# Patient Record
Sex: Female | Born: 1955 | Race: White | Hispanic: No | Marital: Married | Smoking: Former smoker
Health system: Southern US, Community
[De-identification: ages and names within clinical notes are randomized; demographics above are authoritative.]

## PROBLEM LIST (undated history)

## (undated) DIAGNOSIS — M810 Age-related osteoporosis without current pathological fracture: Principal | ICD-10-CM

## (undated) DIAGNOSIS — E7800 Pure hypercholesterolemia, unspecified: Secondary | ICD-10-CM

## (undated) DIAGNOSIS — C801 Malignant (primary) neoplasm, unspecified: Secondary | ICD-10-CM

## (undated) DIAGNOSIS — K589 Irritable bowel syndrome without diarrhea: Secondary | ICD-10-CM

## (undated) HISTORY — DX: Irritable bowel syndrome, unspecified: K58.9

## (undated) HISTORY — PX: OTHER SURGICAL HISTORY: SHX169

## (undated) HISTORY — DX: Malignant (primary) neoplasm, unspecified: C80.1

## (undated) HISTORY — DX: Pure hypercholesterolemia, unspecified: E78.00

## (undated) HISTORY — PX: VAGINAL HYSTERECTOMY: SUR661

## (undated) HISTORY — DX: Age-related osteoporosis without current pathological fracture: M81.0

## (undated) HISTORY — PX: BREAST SURGERY: SHX581

## (undated) HISTORY — PX: TUBAL LIGATION: SHX77

---

## 2001-06-23 ENCOUNTER — Other Ambulatory Visit: Admission: RE | Admit: 2001-06-23 | Discharge: 2001-06-23 | Payer: Self-pay | Admitting: *Deleted

## 2002-07-09 ENCOUNTER — Encounter: Admission: RE | Admit: 2002-07-09 | Discharge: 2002-07-09 | Payer: Self-pay | Admitting: *Deleted

## 2002-08-31 ENCOUNTER — Other Ambulatory Visit: Admission: RE | Admit: 2002-08-31 | Discharge: 2002-08-31 | Payer: Self-pay | Admitting: Gynecology

## 2003-03-04 ENCOUNTER — Observation Stay (HOSPITAL_COMMUNITY): Admission: RE | Admit: 2003-03-04 | Discharge: 2003-03-05 | Payer: Self-pay | Admitting: Gynecology

## 2003-11-18 ENCOUNTER — Other Ambulatory Visit: Admission: RE | Admit: 2003-11-18 | Discharge: 2003-11-18 | Payer: Self-pay | Admitting: Gynecology

## 2004-10-22 ENCOUNTER — Ambulatory Visit (HOSPITAL_COMMUNITY): Admission: RE | Admit: 2004-10-22 | Discharge: 2004-10-23 | Payer: Self-pay | Admitting: Specialist

## 2005-03-08 ENCOUNTER — Other Ambulatory Visit: Admission: RE | Admit: 2005-03-08 | Discharge: 2005-03-08 | Payer: Self-pay | Admitting: Gynecology

## 2005-09-23 HISTORY — PX: OOPHORECTOMY: SHX86

## 2008-05-24 ENCOUNTER — Ambulatory Visit: Payer: Self-pay | Admitting: Obstetrics and Gynecology

## 2008-05-25 ENCOUNTER — Ambulatory Visit: Payer: Self-pay | Admitting: Obstetrics and Gynecology

## 2008-06-02 ENCOUNTER — Ambulatory Visit: Payer: Self-pay | Admitting: Obstetrics and Gynecology

## 2008-06-03 ENCOUNTER — Encounter: Admission: RE | Admit: 2008-06-03 | Discharge: 2008-06-03 | Payer: Self-pay | Admitting: General Surgery

## 2008-06-08 ENCOUNTER — Ambulatory Visit: Admission: RE | Admit: 2008-06-08 | Discharge: 2008-08-26 | Payer: Self-pay | Admitting: Radiation Oncology

## 2008-07-27 ENCOUNTER — Encounter: Admission: RE | Admit: 2008-07-27 | Discharge: 2008-07-27 | Payer: Self-pay | Admitting: Radiation Oncology

## 2008-07-29 ENCOUNTER — Encounter: Admission: RE | Admit: 2008-07-29 | Discharge: 2008-07-29 | Payer: Self-pay | Admitting: Radiation Oncology

## 2008-07-29 ENCOUNTER — Encounter (INDEPENDENT_AMBULATORY_CARE_PROVIDER_SITE_OTHER): Payer: Self-pay | Admitting: Diagnostic Radiology

## 2008-08-16 ENCOUNTER — Encounter: Payer: Self-pay | Admitting: Obstetrics and Gynecology

## 2008-08-16 ENCOUNTER — Ambulatory Visit: Payer: Self-pay | Admitting: Obstetrics and Gynecology

## 2008-08-16 ENCOUNTER — Other Ambulatory Visit: Admission: RE | Admit: 2008-08-16 | Discharge: 2008-08-16 | Payer: Self-pay | Admitting: Obstetrics and Gynecology

## 2008-08-29 ENCOUNTER — Ambulatory Visit: Payer: Self-pay | Admitting: Obstetrics and Gynecology

## 2008-09-26 ENCOUNTER — Ambulatory Visit: Payer: Self-pay | Admitting: Obstetrics and Gynecology

## 2008-10-14 ENCOUNTER — Ambulatory Visit: Payer: Self-pay | Admitting: Obstetrics and Gynecology

## 2008-10-14 ENCOUNTER — Inpatient Hospital Stay (HOSPITAL_COMMUNITY): Admission: RE | Admit: 2008-10-14 | Discharge: 2008-10-18 | Payer: Self-pay | Admitting: General Surgery

## 2008-10-14 ENCOUNTER — Encounter (INDEPENDENT_AMBULATORY_CARE_PROVIDER_SITE_OTHER): Payer: Self-pay | Admitting: General Surgery

## 2008-11-14 ENCOUNTER — Ambulatory Visit: Payer: Self-pay | Admitting: Obstetrics and Gynecology

## 2008-12-07 ENCOUNTER — Ambulatory Visit: Payer: Self-pay | Admitting: Obstetrics and Gynecology

## 2009-01-31 ENCOUNTER — Ambulatory Visit: Admission: RE | Admit: 2009-01-31 | Discharge: 2009-03-30 | Payer: Self-pay | Admitting: Radiation Oncology

## 2009-02-23 ENCOUNTER — Ambulatory Visit: Payer: Self-pay | Admitting: Psychiatry

## 2009-03-02 ENCOUNTER — Ambulatory Visit: Payer: Self-pay | Admitting: Psychiatry

## 2009-03-02 ENCOUNTER — Encounter: Admission: RE | Admit: 2009-03-02 | Discharge: 2009-03-07 | Payer: Self-pay | Admitting: Anesthesiology

## 2009-03-07 ENCOUNTER — Ambulatory Visit: Payer: Self-pay | Admitting: Anesthesiology

## 2009-03-16 ENCOUNTER — Ambulatory Visit: Payer: Self-pay | Admitting: Psychiatry

## 2009-04-05 ENCOUNTER — Ambulatory Visit (HOSPITAL_BASED_OUTPATIENT_CLINIC_OR_DEPARTMENT_OTHER): Admission: RE | Admit: 2009-04-05 | Discharge: 2009-04-05 | Payer: Self-pay | Admitting: Plastic Surgery

## 2009-04-05 ENCOUNTER — Encounter (INDEPENDENT_AMBULATORY_CARE_PROVIDER_SITE_OTHER): Payer: Self-pay | Admitting: Plastic Surgery

## 2009-08-31 ENCOUNTER — Encounter (INDEPENDENT_AMBULATORY_CARE_PROVIDER_SITE_OTHER): Payer: Self-pay | Admitting: Plastic Surgery

## 2009-08-31 ENCOUNTER — Inpatient Hospital Stay (HOSPITAL_COMMUNITY): Admission: RE | Admit: 2009-08-31 | Discharge: 2009-09-03 | Payer: Self-pay | Admitting: Plastic Surgery

## 2009-12-08 ENCOUNTER — Ambulatory Visit: Payer: Self-pay | Admitting: Obstetrics and Gynecology

## 2009-12-08 ENCOUNTER — Other Ambulatory Visit: Admission: RE | Admit: 2009-12-08 | Discharge: 2009-12-08 | Payer: Self-pay | Admitting: Obstetrics and Gynecology

## 2009-12-21 ENCOUNTER — Ambulatory Visit: Payer: Self-pay | Admitting: Oncology

## 2009-12-25 LAB — CBC WITH DIFFERENTIAL/PLATELET
BASO%: 0.6 % (ref 0.0–2.0)
Basophils Absolute: 0 10*3/uL (ref 0.0–0.1)
EOS%: 3.8 % (ref 0.0–7.0)
Eosinophils Absolute: 0.2 10*3/uL (ref 0.0–0.5)
HCT: 33.6 % — ABNORMAL LOW (ref 34.8–46.6)
HGB: 11.5 g/dL — ABNORMAL LOW (ref 11.6–15.9)
LYMPH%: 24.2 % (ref 14.0–49.7)
MCH: 34.4 pg — ABNORMAL HIGH (ref 25.1–34.0)
MCHC: 34.2 g/dL (ref 31.5–36.0)
MCV: 100.6 fL (ref 79.5–101.0)
MONO#: 0.4 10*3/uL (ref 0.1–0.9)
MONO%: 6.9 % (ref 0.0–14.0)
NEUT#: 3.6 10*3/uL (ref 1.5–6.5)
NEUT%: 64.5 % (ref 38.4–76.8)
Platelets: 202 10*3/uL (ref 145–400)
RBC: 3.34 10*6/uL — ABNORMAL LOW (ref 3.70–5.45)
RDW: 13.5 % (ref 11.2–14.5)
WBC: 5.5 10*3/uL (ref 3.9–10.3)
lymph#: 1.3 10*3/uL (ref 0.9–3.3)

## 2009-12-25 LAB — COMPREHENSIVE METABOLIC PANEL
ALT: 18 U/L (ref 0–35)
AST: 19 U/L (ref 0–37)
Albumin: 4.5 g/dL (ref 3.5–5.2)
Alkaline Phosphatase: 56 U/L (ref 39–117)
BUN: 11 mg/dL (ref 6–23)
CO2: 33 mEq/L — ABNORMAL HIGH (ref 19–32)
Calcium: 9.8 mg/dL (ref 8.4–10.5)
Chloride: 102 mEq/L (ref 96–112)
Creatinine, Ser: 0.63 mg/dL (ref 0.40–1.20)
Glucose, Bld: 101 mg/dL — ABNORMAL HIGH (ref 70–99)
Potassium: 4.2 mEq/L (ref 3.5–5.3)
Sodium: 140 mEq/L (ref 135–145)
Total Bilirubin: 0.6 mg/dL (ref 0.3–1.2)
Total Protein: 6.8 g/dL (ref 6.0–8.3)

## 2010-05-14 ENCOUNTER — Ambulatory Visit: Payer: Self-pay | Admitting: Obstetrics and Gynecology

## 2010-10-03 ENCOUNTER — Ambulatory Visit
Admission: RE | Admit: 2010-10-03 | Discharge: 2010-10-03 | Payer: Self-pay | Source: Home / Self Care | Attending: Plastic Surgery | Admitting: Plastic Surgery

## 2010-10-08 LAB — POCT HEMOGLOBIN-HEMACUE: Hemoglobin: 11.9 g/dL — ABNORMAL LOW (ref 12.0–15.0)

## 2010-10-14 ENCOUNTER — Encounter: Payer: Self-pay | Admitting: Radiation Oncology

## 2010-10-20 NOTE — Op Note (Signed)
NAME:  ANYELIN, MOGLE NO.:  [id number]  MEDICAL RECORD NO.:  [id number]          PATIENT TYPE:  AMB  LOCATION:  DSC                          FACILITY:  [facility]  PHYSICIAN:  Loreta Ave, MD DATE OF BIRTH:  08/06/1956  DATE OF PROCEDURE:  10/03/2010 DATE OF DISCHARGE:                              OPERATIVE REPORT   PREOPERATIVE DIAGNOSIS:  Breast cancer.  POSTOPERATIVE DIAGNOSIS:  Breast cancer.  SURGEON:  Loreta Ave, MD  ASSISTANT:  Tonye Becket.  ANESTHESIA:  General.  PROCEDURES PERFORMED: 1. Left breast implant exchange. 2. Left breast capsulotomy.  IV FLUIDS:  500 mL of crystalloid.  URINE OUTPUT:  Not recorded.  ESTIMATED BLOOD LOSS:  Minimal.  COMPLICATIONS:  None.  CLINICAL INDICATION:  Beth Noble is a 55 year old female who is status post bilateral breast reconstruction with breast cancer.  She has breast asymmetry stemming from the fact that her right breast has previously had a tissue expander exposure, which was allowed to heal and then subsequently reconstructed with a latissimus flap.  Her left breast was reconstructed with a tissue expander and subsequent exchange for permanent implant.  Her right breast is somewhat tighter and her implant is superiorly displaced compared to the left breast.  Preoperatively, she would requested that her breast achieved better symmetry.  I told her I felt that it would be very difficult to lower her right breast given the tight nature of the skin and subcutaneous tissue at the inframammary fold.  Alternatively, I presented her the option of trying to elevate the left breast implant.  She understands the risks of surgery to include, but not be limited to ongoing breast asymmetry, failure to correct her deformity as well as bleeding, infection, damage to nearby structures and implant-related complications such as fatigue, migration, capsular contracture, failure and the need for  more surgery. She desires to proceed.  DESCRIPTION OF OPERATION:  The patient was marked in the preoperative holding area and brought to the operating room.  She was placed in the supine position on the operating room table and after smooth and routine induction of general anesthesia, the chest was prepped with chlorhexidine.  Ancef was given preoperatively.  The lateral 6 cm of the left mastectomy scar were excised with a 10-blade and sent to pathology labeled as such.  Next, a 2-cm stair step was designed by elevating the inferior mastectomy flaps to place the capsular incision away from the skin incision.  This elevation was done with electrocautery and the capsule was entered.  Her previous breast implant was found to be intact and was removed with manual traction.  Next, the superomedial 40% of her capsule was incised with electrocautery around its periphery and the pectoralis major muscle was divided with electrocautery to allow elevation of the implant pocket and medialization of the implant.  Next, 16 mL of 0.5% Marcaine with epinephrine were injected about the surgical sites for postoperative analgesia.  The left mastectomy defect was irrigated and hemostasis verified with electrocautery.  Also in an attempt to change the implant location, the inferior 2 cm on the lateral portion of the inframammary fold were imbricated with 3-0 Vicryl pop-off sutures placed in  figure-of-eight fashion.  Next, a Natrelle silicone implant reference number 10-330, serial number [id number] was then brought onto the field and maintained in a bacitracin containing bath of normal saline.  This antibiotic solution was also used to irrigate the left mastectomy defect and skin and gloves were wiped clean.  This implant was then placed using a minimal touch technique into the left mastectomy defect.  Symmetry was found to be improved and there was medial and superior migration of the implant pocket.  Next,  the capsule was closed with interrupted 3-0 Vicryl pop-off sutures in figure-of-eight fashion. Care was taken not to touch the implant with needle thread or instrument anytime.  Next, the skin was closed with buried interrupted 3-0 Monocryl sutures as well as 4-0 running subcuticular Monocryl stitch.  Dermabond and Steri-Strips were then applied.  The patient tolerated the procedure well, was extubated and transported to the recovery room in stable condition.     Loreta Ave, MD     CF/MEDQ  D:  10/03/2010  T:  10/04/2010  Job:  [id number]  Electronically Signed by Loreta Ave MD on 10/20/2010 03:18:54 PM

## 2010-12-18 ENCOUNTER — Other Ambulatory Visit (INDEPENDENT_AMBULATORY_CARE_PROVIDER_SITE_OTHER): Payer: BC Managed Care – PPO

## 2010-12-18 DIAGNOSIS — E7800 Pure hypercholesterolemia, unspecified: Secondary | ICD-10-CM

## 2010-12-25 ENCOUNTER — Encounter (INDEPENDENT_AMBULATORY_CARE_PROVIDER_SITE_OTHER): Payer: BC Managed Care – PPO | Admitting: Obstetrics and Gynecology

## 2010-12-25 ENCOUNTER — Other Ambulatory Visit: Payer: Self-pay | Admitting: Obstetrics and Gynecology

## 2010-12-25 ENCOUNTER — Other Ambulatory Visit (HOSPITAL_COMMUNITY)
Admission: RE | Admit: 2010-12-25 | Discharge: 2010-12-25 | Disposition: A | Discharge status: Home / Self Care | Payer: BC Managed Care – PPO | Source: Ambulatory Visit | Attending: Obstetrics and Gynecology | Admitting: Obstetrics and Gynecology

## 2010-12-25 DIAGNOSIS — Z01419 Encounter for gynecological examination (general) (routine) without abnormal findings: Secondary | ICD-10-CM

## 2010-12-25 DIAGNOSIS — Z124 Encounter for screening for malignant neoplasm of cervix: Secondary | ICD-10-CM | POA: Insufficient documentation

## 2010-12-25 LAB — CBC
HCT: 26.5 % — ABNORMAL LOW (ref 36.0–46.0)
HCT: 36.2 % (ref 36.0–46.0)
Hemoglobin: 12.5 g/dL (ref 12.0–15.0)
Hemoglobin: 9 g/dL — ABNORMAL LOW (ref 12.0–15.0)
MCHC: 34 g/dL (ref 30.0–36.0)
MCHC: 34.6 g/dL (ref 30.0–36.0)
MCV: 102.5 fL — ABNORMAL HIGH (ref 78.0–100.0)
MCV: 102.9 fL — ABNORMAL HIGH (ref 78.0–100.0)
Platelets: 158 10*3/uL (ref 150–400)
Platelets: 210 10*3/uL (ref 150–400)
RBC: 2.58 MIL/uL — ABNORMAL LOW (ref 3.87–5.11)
RBC: 3.53 MIL/uL — ABNORMAL LOW (ref 3.87–5.11)
RDW: 12.5 % (ref 11.5–15.5)
RDW: 12.9 % (ref 11.5–15.5)
WBC: 4.4 10*3/uL (ref 4.0–10.5)
WBC: 5.4 10*3/uL (ref 4.0–10.5)

## 2010-12-30 LAB — POCT HEMOGLOBIN-HEMACUE: Hemoglobin: 11.7 g/dL — ABNORMAL LOW (ref 12.0–15.0)

## 2011-01-03 ENCOUNTER — Ambulatory Visit: Payer: BC Managed Care – PPO | Attending: Radiation Oncology | Admitting: Radiation Oncology

## 2011-01-07 LAB — URINALYSIS, ROUTINE W REFLEX MICROSCOPIC
Bilirubin Urine: NEGATIVE
Glucose, UA: NEGATIVE mg/dL
Hgb urine dipstick: NEGATIVE
Ketones, ur: NEGATIVE mg/dL
Nitrite: NEGATIVE
Protein, ur: NEGATIVE mg/dL
Specific Gravity, Urine: 1.005 (ref 1.005–1.030)
Urobilinogen, UA: 0.2 mg/dL (ref 0.0–1.0)
pH: 6.5 (ref 5.0–8.0)

## 2011-01-07 LAB — DIFFERENTIAL
Basophils Absolute: 0.1 10*3/uL (ref 0.0–0.1)
Basophils Relative: 1 % (ref 0–1)
Eosinophils Absolute: 0.1 10*3/uL (ref 0.0–0.7)
Eosinophils Relative: 3 % (ref 0–5)
Lymphocytes Relative: 28 % (ref 12–46)
Lymphs Abs: 1.5 10*3/uL (ref 0.7–4.0)
Monocytes Absolute: 0.4 10*3/uL (ref 0.1–1.0)
Monocytes Relative: 7 % (ref 3–12)
Neutro Abs: 3.2 10*3/uL (ref 1.7–7.7)
Neutrophils Relative %: 60 % (ref 43–77)

## 2011-01-07 LAB — URINE MICROSCOPIC-ADD ON

## 2011-01-07 LAB — BASIC METABOLIC PANEL
BUN: 3 mg/dL — ABNORMAL LOW (ref 6–23)
CO2: 28 mEq/L (ref 19–32)
Calcium: 8.1 mg/dL — ABNORMAL LOW (ref 8.4–10.5)
Chloride: 101 mEq/L (ref 96–112)
Creatinine, Ser: 0.53 mg/dL (ref 0.4–1.2)
GFR calc Af Amer: >60 mL/min (ref >60)
GFR calc non Af Amer: >60 mL/min (ref >60)
Glucose, Bld: 87 mg/dL (ref 70–99)
Potassium: 3.3 mEq/L — ABNORMAL LOW (ref 3.5–5.1)
Sodium: 136 mEq/L (ref 135–145)

## 2011-01-07 LAB — COMPREHENSIVE METABOLIC PANEL
ALT: 23 U/L (ref 0–35)
AST: 23 U/L (ref 0–37)
Albumin: 4.7 g/dL (ref 3.5–5.2)
Alkaline Phosphatase: 74 U/L (ref 39–117)
BUN: 7 mg/dL (ref 6–23)
CO2: 29 mEq/L (ref 19–32)
Calcium: 9.9 mg/dL (ref 8.4–10.5)
Chloride: 102 mEq/L (ref 96–112)
Creatinine, Ser: 0.52 mg/dL (ref 0.4–1.2)
GFR calc Af Amer: >60 mL/min (ref >60)
GFR calc non Af Amer: >60 mL/min (ref >60)
Glucose, Bld: 103 mg/dL — ABNORMAL HIGH (ref 70–99)
Potassium: 4.1 mEq/L (ref 3.5–5.1)
Sodium: 141 mEq/L (ref 135–145)
Total Bilirubin: 0.6 mg/dL (ref 0.3–1.2)
Total Protein: 6.8 g/dL (ref 6.0–8.3)

## 2011-01-07 LAB — CBC
HCT: 27.1 % — ABNORMAL LOW (ref 36.0–46.0)
HCT: 37.5 % (ref 36.0–46.0)
Hemoglobin: 12.6 g/dL (ref 12.0–15.0)
Hemoglobin: 9.1 g/dL — ABNORMAL LOW (ref 12.0–15.0)
MCHC: 33.5 g/dL (ref 30.0–36.0)
MCHC: 33.6 g/dL (ref 30.0–36.0)
MCV: 101.1 fL — ABNORMAL HIGH (ref 78.0–100.0)
MCV: 101.8 fL — ABNORMAL HIGH (ref 78.0–100.0)
Platelets: 175 10*3/uL (ref 150–400)
Platelets: 219 10*3/uL (ref 150–400)
RBC: 2.66 MIL/uL — ABNORMAL LOW (ref 3.87–5.11)
RBC: 3.71 MIL/uL — ABNORMAL LOW (ref 3.87–5.11)
RDW: 12.1 % (ref 11.5–15.5)
RDW: 12.2 % (ref 11.5–15.5)
WBC: 5.3 10*3/uL (ref 4.0–10.5)
WBC: 7.9 10*3/uL (ref 4.0–10.5)

## 2011-01-09 ENCOUNTER — Encounter (INDEPENDENT_AMBULATORY_CARE_PROVIDER_SITE_OTHER): Payer: BC Managed Care – PPO

## 2011-01-09 DIAGNOSIS — M899 Disorder of bone, unspecified: Secondary | ICD-10-CM

## 2011-02-05 NOTE — Op Note (Signed)
NAME:  Beth, Noble       ACCOUNT NO.:  [id number]   MEDICAL RECORD NO.:  [id number]          PATIENT TYPE:  OIB   LOCATION:  5156                         FACILITY:  [facility]   PHYSICIAN:  Daniel L. Gottsegen, M.D.DATE OF BIRTH:  09-Jul-1956   DATE OF PROCEDURE:  10/14/2008  DATE OF DISCHARGE:                               OPERATIVE REPORT   PREOPERATIVE DIAGNOSIS:  Left lower quadrant pain with endometriosis.   POSTOPERATIVE DIAGNOSIS:  Left lower quadrant pain with endometriosis.   NAME OF OPERATION:  Laparoscopy with initial left oophorectomy including  the cervix followed by right salpingo-oophorectomy.   SURGEON:  Daniel L. Eda Paschal, MD.   FIRST ASSISTANT:  Lorne Skeens. Hoxworth, MD.   INDICATIONS:  The patient is a 55 year old gravida 3, para 2, AB1 who by  history was status post supracervical hysterectomy and right salpingo-  oophorectomy done at 2 different procedures.  The patient's findings had  been endometriosis.  She continued to have pain and at the time of her  breast cancer surgery, which was to be this morning she was also going  to have  a laparoscopy to remove her left ovary for relief of this  persistent left lower quadrant pain.  The patient does understand that  this may not completely relieve her discomfort.  She also knows that we  may not be able to do this laparoscopically.  We will not make an  incision, but we will let the general surgeon and the plastic surgeon do  her mastectomy and reconstruction and not proceed with an open  laparotomy for left salpingo-oophorectomy.   FINDINGS:  At the time of laparoscopy, we initially directed our  attention to the left side.  There was stenotic left ovary adherent into  the vaginal cuff as that ovary was removed, we inadvertently entered the  vagina in order to free up the ovary and found that we actually had also  gotten around her cervix, and therefore, her cervix was removed as well.  Once this part  of the procedure had been done, we inspected the right  lower quadrant and even though her operative note had been read as well  as her Pathology report from [place],  there was a normal right ovary  and tube.  The patient is now menopausal and based on her wishes the  plan at that point then was to remove that as well.   PROCEDURE:  After general endotracheal anesthesia, the patient was  placed in a dorsal lithotomy position, prepped and draped in a usual  sterile manner.  Using an Optiview and a 10-mm laparoscope, direct entry  into the peritoneal cavity was done without difficulty.  The  pneumoperitoneum was created and it been an atraumatic entry.  Two 5-mm  ports were placed in the left lower quadrant without difficulty.  Using  a instrument to elevate the left adnexal  staying clearly away from the  ureter and using a ligature, the left ovary could be dissected free from  the surrounding structures without difficulty, without significant  bleeding.  It was noted, however, that it was extremely fibrotic and  scarred and it was  fairly adherent to the peritoneum.  As we tried to  get around it, it did become obvious to [place] that it was adherent to her  cervix.  However, finally trying to exercise the entire thing.  We found  that we had entered the vaginal cuff posteriorly.  We completely excised  this mass, which we found at that point consisted of ovary and cervix;  however, at the point laparoscopically, you could only see the  cervical  part of it and  the assumption was that this was just ovary, however, it  did include the cervix as well, although it was not obvious at that  time.  Once the adnexa had been completely separated from the broad  ligament and from the vaginal cuff, Dr. Eda Paschal went below and the  structure was removed.  At this point, it was obvious that we had cervix  as well as ovary and in retrospect it would not have been possible to  have removed the ovary  without removing the cervix as they were so  densely adherent to each other.  The vaginal cuff was then closed with  figure-of-eight of 0 Vicryl with complete control of hemostasis.  The  left ovary and cervix were sent to Pathology for tissue diagnosis.  Dr.  Eda Paschal then went above, during the time that he was re-gloving, Dr.  Johna Sheriff was looking in the right lower quadrant and found that indeed  she still had a right ovary and tube.  This could be elevated away from  all the other structures.  It was removed using a ligature for control  of hemostasis and cutting and then this was removed with an Endopouch as  well.  Copious irrigation was then done with sterile saline.  Two-  sponge, needle, and instrument counts were correct.  All trocars were  removed, and the skin incisions were all closed with Dermabond.  The  right ovary and tube were also sent to Pathology for tissue diagnosis.  Estimated blood loss was less than 100 mL with none replaced.  The  patient did not leave the operating room at that point.  She was then  prepared for her mastectomy, at that point, however, the urine was still  clear.  At one point, we had put in indigo carmine, but then we both  felt comfortable that there had been no ureteral involvement in all of  the above surgery.      Daniel L. Eda Paschal, M.D.  Electronically Signed     DLG/MEDQ  D:  10/14/2008  T:  10/15/2008  Job:  [id number]

## 2011-02-05 NOTE — Op Note (Signed)
NAME:  Beth Noble, Beth Noble       ACCOUNT NO.:  [id number]   MEDICAL RECORD NO.:  [id number]          PATIENT TYPE:  OIB   LOCATION:  2550                         FACILITY:  [facility]   PHYSICIAN:  Sharlet Salina T. 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   SURGICAL PROCEDURES:  Bilateral total mastectomy with right axillary  sentinel lymph node biopsy.   SURGEON:  Dr. Johna Sheriff   ANESTHESIA:  General.   BRIEF HISTORY:  Ms. 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  She had a persistent seroma  in the right breast that has been of concern with repeated aspirations.  Follow-up imaging has now revealed new microcalcifications adjacent to  the previous lumpectomy site and biopsy has revealed recurrent ductal  carcinoma in situ.  After discussion of options, we have elected to  proceed with bilateral total mastectomy and right axillary sentinel  lymph node biopsy and immediate reconstruction.  The nature of  procedures, alternatives, risks of bleeding, infection, neurovascular  injury, wound healing issues were discussed and understood  preoperatively.  She is now brought to the operating room for this  procedure.   DESCRIPTION OF PROCEDURE:  At the completion of Dr. Verl Dicker pelvic  surgery, the patient was positioned supine, arms extended and the entire  chest and upper arms widely sterilely prepped and draped.  She received  1 mCi of technetium sulfur colloid injected intradermally around the  right nipple about 2 hours previously and 10 cc of methylene blue was  injected with sterile technique  subareolar and massaged for several  minutes.  This was on the right side.  Right-sided initially was  approached.  An elliptical incision was made encompassing the lateral  lumpectomy site and seroma.  Dissection was carried down to the  subcutaneous tissue.  Skin and subcutaneous flaps were then raised with  cautery superiorly up toward the clavicle to the upper edge of the  breast tissue, medially over to the edge of the sternum, inferiorly down  to the inframammary crease.  Laterally, the flaps were created up over  the seroma without getting into it and this was extended out to the  latissimus laterally which was identified.  The specimen was then  dissected up off of the chest wall with cautery, preserving the  pectoralis fascia for the most part medially and over the center of the  muscle.  As dissection progressed out laterally, we came up to the  seroma cavity and there was a fair amount of inflammation and firmness  here with adhesion to the edge of the pectoralis major, to the serratus  muscle and actually to the anterior border of the latissimus dorsi.  Dissection was carried outside the seroma cavity into normal tissue  which involved removing  a small portion of all these muscles with the  seroma cavity to get back to normal healthy-appearing tissue.  Further  attachments along the latissimus were then mobilized.  I dissected the  specimen up to the axillary content.  The axillary tissue was exposed.  The NeoProbe was used to localize an area hot in the axilla which was  fairly high and medial along the pectoral nerve.  Dissection was carried  down onto this area and a bright blue lymph node identified was  dissected free.  Vascular pedicles were controlled with clips and it was  completely excised.  Ex vivo, the node had counts of greater than 800  with background around 15.  This was sent for hot blue sentinel lymph  node.  This returned showing no cancer cells.  We then  came across the  tail of Spence at the junction of the axilla with Kelly clamps.  A  specimen was removed and this area was tied with Vicryl ties.  The wound  was irrigated and complete hemostasis assured.  Attention was then  turned to the left side.  Again, a transverse elliptical incision was  made removing the nipple and areola and taking minimal breast skin.  Flaps were raised identically to the other side and then the breast was  reflected from either lateral off the pectoralis major, freed from the  lateral border of pectoralis major and from the serratus and latissimus  using cautery and dissection carried up to the junction of the tail of  Spence and axilla, and we came across the low axilla with clamps and the  specimen removed.  This was tied with Vicryl.  The wound was irrigated  and complete hemostasis assured.  At this point, Dr. Noelle Penner came in for  reconstruction.      Lorne Skeens. Hoxworth, M.D.  Electronically Signed     BTH/MEDQ  D:  10/14/2008  T:  10/14/2008  Job:  [id number]

## 2011-02-05 NOTE — Op Note (Signed)
NAME:  Beth Noble, Beth Noble NO.:  [id number]   MEDICAL RECORD NO.:  [id number]          PATIENT TYPE:  AMB   LOCATION:  DSC                          FACILITY:  [facility]   PHYSICIAN:  Loreta Ave, MD DATE OF BIRTH:  [birth year]   DATE OF PROCEDURE:  04/05/2009  DATE OF DISCHARGE:                               OPERATIVE REPORT   PREOPERATIVE DIAGNOSIS:  Right breast cancer.   POSTOPERATIVE DIAGNOSIS:  Right breast cancer.   PROCEDURE PERFORMED:  Removal of left breast tissue expander and  placement of silicone-filled breast implant.   SURGEON:  Loreta Ave, MD   ASSISTANT:  None.   SPECIMENS:  1. Left mastectomy scar.  2. Left breast tissue expander.   ESTIMATED BLOOD LOSS:  Minimal.   COMPLICATIONS:  None.   DRAINS:  None.   COMPONENTS USED:  Allergan NATRELLE silicone-filled breast implant,  reference number [id number], serial number [id number].   CLINIC INDICATION:  Beth Noble is a 55 year old female with a  history of right-sided breast cancer.  She is undergone bilateral  mastectomies and had previous right tissue expander extrusion.  This has  healed.  She has also undergone placement of the left breast tissue  expander concurrently with the right.  This one has been expanded and is  ready for exchange to a permanent implant.  The patient understands the  risks of surgery include but are not limited to bleeding, infection,  stiffness, scarring, capsular contracture, implant failure, and the need  for more surgery.  Beth Noble understands these risks and desires to  proceed.   DESCRIPTION OF OPERATION:  The patient ambulated to the operating room  suite and reclined in the supine position on the operating room table.  After smooth and routine induction of general anesthesia with laryngeal  mask airway, the chest was prepped with ChloraPrep and draped into a  sterile field.  Prior to prep, 20 mL of 0.5% Marcaine with 1:200,000  epinephrine were injected along the left mastectomy scar and along the  medial aspect of the left breast to aid in hemostasis and postoperative  analgesia.  After waiting for the hemostatic effects with epinephrine to  take hold, a 4-cm area of the left mastectomy incision was excised with  a 15-blade and sent to pathology labeled left mastectomy scar.  Dissection carried down deeply to the implant with electrocautery.  The  implant was encountered, freed manually from capsular attachments,  ruptured with a 10-blade, and the implant was suctioned out and the  tissue expander was removed with manual traction.  Next, capsulotomy was  performed on the medial two thirds of the capsule around the tissue  expander.  This was done with electrocautery.  The left breast pocket  was irrigated with bacitracin-containing normal saline and hemostasis  verified with electrocautery.  It was again irrigated with the same  bacitracin containing solution which was allowed to remain in the wound  without suction.  Next, NATRELLE silicone-filled breast implant,  reference number [id number], serial number [id number] was brought onto the  field and maintained in a bath of bacitracin-containing normal saline.  The skin, instruments, and gauze were wiped clean with  a towel-  containing solution.  The implant was then inserted via the incision  with manual pressure.  After placement in the left breast cavity, it was  oriented with the manufacturing stamp facing posteriorly and centered.  Next, the wound was closed with a combination of 3-0 buried interrupted  Monocryl sutures in a running 4-0 subcuticular Monocryl stitch.  Dermabond and Steri-Strips were applied.  Sponge and needle counts were  reported as correct.  An ABD pad was placed in the axilla to medialize  the breast and the patient was wrapped circumferentially with a 6-inch  Ace wrap.  She was extubated and transported to the recovery room in  stable  condition.      Loreta Ave, MD  Electronically Signed     Loreta Ave, MD  Electronically Signed    CF/MEDQ  D:  04/05/2009  T:  04/05/2009  Job:  [id number]

## 2011-02-05 NOTE — Assessment & Plan Note (Signed)
Beth Noble has got an referral to [place] for management of chest  wall pain from reconstruction.  An individual who has had her experience  with breast cancer that started as a lumpectomy, evolving into  recurrence, and then bilateral mastectomies with reconstruction.  The  right tissue expander was removed secondary to reactive tissues.  The  left is starting to have some peri-incisional erythema, and she is  continuing to see her surgeon.  Her surgeon is Loreta Ave, MD.  Relating her pain is 8/10 functional impairment secondary to pain  described as sharp, burning, and stabbing, sometimes aching, no temporal  relation today.  Of note, she also had some paracervical discomfort from  degenerative components and cervical spine particularly directed the  left described as spondylitic to the levator scapular region.  That is  worst with standing and activities.  Improved with rest, therapy, and  medications.  She has tried number of medications including opioids, and  states, taking up to 8 ibuprofen a day and I cautioned.  She is  currently not working.  She has not worked since August 2008, 14-point  review of systems, noted she had some situational anxiety and depression  recently started on Cymbalta and has been a strong responder there.  States no wish to harm self or others.  She has had neck surgery dating  to 2006 , and then breast cancer surgery x3, most recently in May of  this year, with tissue expansion in progress, left.  That is where most  of her pain is residing, peri-incisionally, described as dysesthetic.  She is married, supported strong relationship.  She was a smoker.  She  quit earlier this year.   FAMILY HISTORY:  Diabetes, hypertension.   REVIEW OF SYSTEMS:  Family and social history, otherwise noncontributory  to pain problem.   PHYSICAL EXAMINATION:  GENERAL:  Pleasant female, sitting comfortably in  bed.  Gait and affect appears normal oriented  x3.  HEENT:  Otherwise unremarkable.  CHEST:  Clear to auscultation and percussion with increased AP diameter.  HEART:  Regular rate and rhythm without rub, murmur, or gallop.  ABDOMEN:  Soft, nontender, benign.  No hepatosplenomegaly.  She has  diffuse paracervical, suprascapular, myofascial discomfort, particularly  to the left well-healed posterior midline incision.  EXTREMITIES:  Her tissue expanded to the left is evident right, absent,  the incision, peri-incisionally to the left.  She has some erythema.  She states that it is advancing.  She has some pull away, she has some  hyperplastic descriptors, demonstrated by exam.  Pain in the  paracervical region is mostly myofascial mechanical.   IMPRESSION:  1. Peripheral neuropathy unspecified, breast cancer, with tissue      expansion and peri-incisional discomfort.  2. 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   PLAN:  1. We will go ahead and trial Lidoderm topical, if she has any      reaction to this, she is to stop, and also to talk to her surgeon      to make sure that they are okay with the topical application.  I      see really no downside to it at this time.  2. Her breast has some modest erythema, but peri-incisionally, and it      does not look like an infection, there is some modest reactive      tissue, she is going to see her surgeon this week.  3. Recommend again to use NSAID at that level particularly considering      age of potential cardiovascular implications.  Recommend  Tramadol,      we will start her on that emphasizing nonnarcotic options, possible      pharmacokinetic, long-acting option would be Ultram ER, and if      necessary something for breakthrough.  She      has been on Neurontin, that is probably helping, tried Lyrica in      the past, and we will follow up expectantly and see how she does.      Discharge instructions given.  No barrier to communication.       Nursing counseling.           ______________________________  Celene Kras, MD     HH/MedQ  D:  03/07/2009 12:49:19  T:  03/08/2009 02:54:24  Job #:  [id number]   cc:   Loreta Ave, MD  Fax: [phone]

## 2011-02-05 NOTE — Discharge Summary (Signed)
NAME:  Beth Noble, SPRADLIN NO.:  [id number]   MEDICAL RECORD NO.:  [id number]          PATIENT TYPE:  INP   LOCATION:  5156                         FACILITY:  [facility]   PHYSICIAN:  Loreta Ave, MD DATE OF BIRTH:  26-Oct-1955   DATE OF ADMISSION:  10/14/2008  DATE OF DISCHARGE:  10/18/2008                               DISCHARGE SUMMARY   ADMITTING PHYSICIAN:  Sharlet Salina T. Hoxworth, MD   CONSULTANTS:  1. Daniel L. Eda Paschal, MD  2. Loreta Ave, MD   ADMISSION DIAGNOSES:  1. Endometriosis.  2. Recurrent right breast cancer.   PROCEDURE PERFORMED:  On October 14, 2008, the patient underwent  laparoscopic bilateral salpingo-oophorectomy by Dr. Eda Paschal.  Dr.  Johna Sheriff performed bilateral mastectomies with right sentinel lymph node  biopsy.  Dr. Noelle Penner performed placement of bilateral tissue expanders.   PAST MEDICAL HISTORY:  Significant for back pain, endometriosis, history  of right-sided breast cancer and recurrent right breast cancer.   PAST SURGICAL HISTORY:  Significant for back surgery, hysterectomy, and  right oophorectomy.   ALLERGIES:  MORPHINE and CODEINE.   SOCIAL HISTORY:  She smokes pack of cigarettes per day, drinks  moderately.   FAMILY HISTORY:  She denies.   DESCRIPTION OF HOSPITAL COURSE:  The patient was admitted to the  hospital on October 14, 2008, and taken to the operating room where the  above procedures were performed.  She tolerated the surgery well, was  extubated and transferred to the floor in stable condition.  Her diet  was advanced as tolerated.  She was initially maintained on IV opioids  for analgesia and transitioned to oral as tolerated.  She had recurrent  problems with pruritus secondary to opioid medication and headaches in  the postoperative period.  At the time of discharge, she is ambulating  independently.  She is tolerating regular diet.  She has been instructed  on [facility] home care and measurement.  Her  pain is adequately controlled with  Darvocet-N 100.  She is being given instructions that she may shower.  She is not to tub bathe.  She is to increase her activity slowly.  She  is not to lift or do any driving.  She is also to measure and recorded  her drain output daily.  She will see Dr. Noelle Penner in the coming week for  followup.  She  is to call for that appointment and she is to see Dr. Johna Sheriff in 2  weeks' time and call for that appointment as well.  She is to resume her  home medications.  She is additionally being given prescriptions for  Keflex 500 mg p.o. q.6 h., Darvocet-N 100 one p.o. q.4-6 h. and she is  being told to take Colace 100 mg p.o. q.12 h. while taking Darvocet.      Loreta Ave, MD  Electronically Signed     CF/MEDQ  D:  10/18/2008  T:  10/19/2008  Job:  [id number]

## 2011-02-05 NOTE — Op Note (Signed)
NAME:  Beth Noble, Beth Noble NO.:  [id number]   MEDICAL RECORD NO.:  [id number]          PATIENT TYPE:  OIB   LOCATION:  5156                         FACILITY:  [facility]   PHYSICIAN:  Loreta Ave, MD DATE OF BIRTH:  1956/03/08   DATE OF PROCEDURE:  10/14/2008  DATE OF DISCHARGE:                               OPERATIVE REPORT   PREOPERATIVE DIAGNOSIS:  Recurrent right breast cancer.   POSTOPERATIVE DIAGNOSIS:  Recurrent right breast cancer.   PROCEDURE PERFORMED:  Dr. Johna Sheriff performed bilateral mastectomies and  right sentinel lymph node biopsy.  Dr. Noelle Penner performed placement of  bilateral subpectoral tissue expanders.   COMPLICATIONS:  None.   ESTIMATED BLOOD LOSS:  25 mL for my portion of the operation.   URINE OUTPUT:  Per Anesthesia.   IV FLUIDS:  Per Anesthesia.   DRAINS:  Jackson-Pratt x4 to each breast.   CLINICAL INDICATION:  Beth Noble is a 55 year old Caucasian  female with recurrent right breast cancer.  She underwent lumpectomy,  and MammoSite balloon radiation in the past to a lesion in the upper  outer quadrant of her right breast.  She presents with a new cancer at  this time.  We will be undergoing a right mastectomy for curative intent  and a left prophylactic mastectomy.   After discussion of the possible reconstructive options including right-  sided latissimus flap or possible local tissue rearrangement or possible  direct closure in addition to placement of bilateral subpectoral tissue  expanders.  Yania understands that the risks of surgery include but  are not limited to bleeding, infection, damage to the nearby structures,  hematoma, seroma, implant malposition, implant failure or rupture, and  the need for future surgery.  She desires to proceed.  Her goal was to  limit the use of adjacent tissue for right breast reconstruction, and  she only wanted to undergo a latissimus flap on the right if there was  significant soft-tissue defect that needed additional skin coverage.   DESCRIPTION OF OPERATION:  I was called in to the operating room as Dr.  Johna Sheriff finished the left mastectomy.  After scrubbing in, I first  turned my attention to the left, noncancerous breast.  The mastectomy  defect was irrigated with normal saline and hemostasis assured with  electrocautery.  The pectoralis major muscles were approached from this  lateral edge and elevated off the chest wall dividing its inferior  costal attachments to the level of its inferior most attachment with the  sternum.  Next, it was dissected off the pectoralis minor and chest wall  progressing superiorly with electrocautery.  The pocket was then  measured subpectorally for height and width to make sure that the  preordered implants would fit in adequately in dissected pocket.  Next,  a piece of SurgiMend, reference number [id number], lot [id number] was  brought onto the field and soaked in normal saline.  Next, the SurgiMend  was inset at the inferior aspect of the pectoralis muscle.  This was  done with a running 2-0 PDS suture from the medial costal edge of the  pectoralis muscle progressing along its free cut edge laterally.  Next,  a 2-0 PDS suture was used in running fashion at the level of the  inframammary fold to the inferior edge of the SurgiMend.  This too was  done with running fashion progressing laterally.  Next, a tissue  expander of Allergan model , reference number 133MV-12, serial  number [id number] was brought on to the field and maintained in a  bacitracin containing bath of normal saline.  All residual air was  aspirated with the 21-gauge butterfly needle.  The left mastectomy  defect and subpectoral pocket were irrigated with bacitracin containing  normal saline, which was not aspirated from the wound.  Next, the  implant was placed subpectorally at the left using no-touch technique.  It was moved into position and  the dead space at the lateral edge was  obliterated.  This was done by tacking the lateral skin and subcutaneous  tissue to the lateral chest wall and tacking the lateral free edge of  the SurgiMend down to the chest wall with 2-0 PDS sutures.  Next, two 96-  [place] round Blake drains were placed at the inferolateral aspect of the  breast via separate stab incisions and secured to the skin with silk  sutures.  Next, the mastectomy defect was irrigated with normal saline  and inspected for hemostasis.  The skin was then closed with  interrupted, buried 3-0 Monocryl sutures, and running subcuticular  stitch of the same.  Next, the fill port was located via magnetic and  access with the 21-gauge butterfly needle.  A 150 mL of normal saline  were injected via closed fill circuit.  There was no tension on the skin  at the end of filling the implant.   Next, attention was turned to the right mastectomy defect.  The wound  was irrigated with normal saline and hemostasis assured with  electrocautery.  Next, the pectoralis major muscle was approached from  its lateral free edge and elevated off the chest wall with  electrocautery.  Its inferior costal attachments were divided to the  level of its inferior attachment with the sternum.  Next, it was  dissected off the pectoralis minor muscle on the chest wall with  electrocautery moving superiorly.  The subpectoral pocket was measured  to verify that it would adequately contain the preordered implant.  Next, a piece of SurgiMend with the exact same reference number of  [id number] and lot number is [id number] was placed in the right chest.  This  was done by rehydrating normal saline on the scrub table for 1 minute.  Next, it was inset at its superior edge to the inferior border of the  pectoralis muscle starting at its inferior attachments to the sternum  progressing laterally with a running 2-0 PDS suture.  Next, it was  secured on its inferior free  edge to the inframammary fold with a  running 2-0 PDS suture.  Next, tissue expander, Allergan style ,  reference number 133MV-12, serial number [id number], was brought onto the  sterile field and maintained in a bacitracin containing bath of normal  saline.  All residual air was aspirated out of the implant with a 21-  gauge butterfly needle.  The mastectomy defect was irrigated with  bacitracin containing normal saline, which was not aspirated from the  wound.  The implant was then placed in the subpectoral pocket using no-  touch technique.  Next, the residual, lateral dead space was obliterated  by tacking the lateral skin subcutaneous fat to the chest wall with 2-0  PDS sutures.  The lateral, free edge of the SurgiMend was also tacked to  the lateral chest wall with the same type of suture.  Next, the  mastectomy defect was irrigated with normal saline and hemostasis  verified.  The skin was closed with interrupted buried 3-0 Monocryl  sutures and a running subcuticular stitch of the same.  Sponge and  needle counts were reported as correct x2.  The right tissue expander  was accessed via its port via magnetic guidance with the 21-gauge  butterfly needle.  A 150 mL of normal saline were injected via closed  fill system.  There was zero tension on the skin edges at the time of  completion of injection.  Next, Dermabond and Steri-Strips were applied  to both breast incisions.  The patient was extubated and transferred to  the recovery room in stable condition.      Loreta Ave, MD  Electronically Signed     CF/MEDQ  D:  10/14/2008  T:  10/15/2008  Job:  [id number]

## 2011-07-10 ENCOUNTER — Encounter (HOSPITAL_BASED_OUTPATIENT_CLINIC_OR_DEPARTMENT_OTHER): Payer: BC Managed Care – PPO | Admitting: Oncology

## 2011-07-10 ENCOUNTER — Other Ambulatory Visit: Payer: Self-pay | Admitting: Oncology

## 2011-07-10 DIAGNOSIS — D0590 Unspecified type of carcinoma in situ of unspecified breast: Secondary | ICD-10-CM

## 2011-07-10 LAB — CBC WITH DIFFERENTIAL/PLATELET
BASO%: 0.7 % (ref 0.0–2.0)
MCHC: 33.7 g/dL (ref 31.5–36.0)
MONO#: 0.3 10*3/uL (ref 0.1–0.9)
RBC: 3.66 10*6/uL — ABNORMAL LOW (ref 3.70–5.45)
WBC: 3.9 10*3/uL (ref 3.9–10.3)
lymph#: 1.1 10*3/uL (ref 0.9–3.3)

## 2011-07-10 LAB — COMPREHENSIVE METABOLIC PANEL
Albumin: 5.1 g/dL (ref 3.5–5.2)
CO2: 29 mEq/L (ref 19–32)
Glucose, Bld: 93 mg/dL (ref 70–99)
Potassium: 4.6 mEq/L (ref 3.5–5.3)
Sodium: 140 mEq/L (ref 135–145)
Total Protein: 7.3 g/dL (ref 6.0–8.3)

## 2011-08-08 ENCOUNTER — Other Ambulatory Visit: Payer: Self-pay | Admitting: Plastic Surgery

## 2011-09-27 ENCOUNTER — Encounter: Payer: Self-pay | Admitting: *Deleted

## 2011-09-27 NOTE — Progress Notes (Signed)
Pt had right breast reconstruction, remove implant capulectomy with silicone implant and left breast reconstruction remove and replace implant on 08/08/11 by Dr. Etter Sjogren.

## 2012-01-07 ENCOUNTER — Encounter: Payer: Self-pay | Admitting: Gynecology

## 2012-01-07 DIAGNOSIS — N809 Endometriosis, unspecified: Secondary | ICD-10-CM | POA: Insufficient documentation

## 2012-01-07 DIAGNOSIS — Z853 Personal history of malignant neoplasm of breast: Secondary | ICD-10-CM | POA: Insufficient documentation

## 2012-01-08 ENCOUNTER — Encounter: Payer: Self-pay | Admitting: Obstetrics and Gynecology

## 2012-01-08 ENCOUNTER — Ambulatory Visit (INDEPENDENT_AMBULATORY_CARE_PROVIDER_SITE_OTHER): Payer: BC Managed Care – PPO | Admitting: Obstetrics and Gynecology

## 2012-01-08 VITALS — BP 116/74 | Ht 60.5 in | Wt 142.0 lb

## 2012-01-08 DIAGNOSIS — Z01419 Encounter for gynecological examination (general) (routine) without abnormal findings: Secondary | ICD-10-CM

## 2012-01-08 DIAGNOSIS — E7800 Pure hypercholesterolemia, unspecified: Secondary | ICD-10-CM | POA: Insufficient documentation

## 2012-01-08 NOTE — Progress Notes (Signed)
The patient came back to see me today for an annual GYN exam. She has endometriosis and was very symptomatic. She had had a supracervical hysterectomy and unilateral salpingo-oophorectomy. Because of her pain we went back and laparoscopically remove her cervix and her remaining ovary and tube. She has had no pain since we did her surgery. She has now had bilateral mastectomies for breast cancer. She has had trouble with reconstruction due to previous breast radiation. She now however has implants that seemed to be doing well. She is having no menopausal symptoms. She does take Neurontin for her neck and I believe that may be helping the above. She is very unhappy because of no libido. After consultation with her breast cancer physician we gave her testosterone and she did not think it helped. Neither of [place] are sure that she used it often enough. She is not currently having intercourse and  has no comment about vaginal dryness. Her estrogen receptors were negative. Her last bone density was in 2012 and she had osteopenia without an elevated fracture risk. She has had no fractures.  HEENT: Within normal limits. Kennon Portela present. Neck: No masses. Supraclavicular lymph nodes: Not enlarged. Breasts: Examined in both sitting and lying position. She is status post bilateral mastectomies and no masses are palpated. Abdomen: Soft no masses guarding or rebound. No hernias. Pelvic: External within normal limits. BUS within normal limits. Vaginal examination shows poor estrogen effect, no cystocele enterocele or rectocele. Cervix and uterus absent. Adnexa within normal limits. Rectovaginal confirmatory. Extremities within normal limits.  Assessment: #1. Breast cancer status post bilateral mastectomies #2. Atrophic vaginitis #3. Osteopenia #4. Diminished libido  Plan: She will again tried testosterone cream 2% but use nightly for 10 weeks. If no results she will stop it and discuss  with the oncologist the pros  and cons of estrogen in spite of breast cancer. She will continue periodic bone densities. She will do lab through her PCP. If she starts to have intercourse again she will call when necessary need for treatment of her atrophic vaginitis.

## 2012-01-09 LAB — URINALYSIS W MICROSCOPIC + REFLEX CULTURE
Bilirubin Urine: NEGATIVE
Hgb urine dipstick: NEGATIVE
Leukocytes, UA: NEGATIVE
Protein, ur: NEGATIVE mg/dL
Squamous Epithelial / LPF: NONE SEEN
Urobilinogen, UA: 0.2 mg/dL (ref 0.0–1.0)

## 2012-06-19 ENCOUNTER — Telehealth: Payer: Self-pay | Admitting: Oncology

## 2012-06-19 NOTE — Telephone Encounter (Signed)
lmonvm adviisng the pt of her oct appts °

## 2012-07-21 ENCOUNTER — Other Ambulatory Visit: Payer: Self-pay | Admitting: *Deleted

## 2012-07-21 DIAGNOSIS — C801 Malignant (primary) neoplasm, unspecified: Secondary | ICD-10-CM

## 2012-07-22 ENCOUNTER — Encounter: Payer: Self-pay | Admitting: Oncology

## 2012-07-22 ENCOUNTER — Telehealth: Payer: Self-pay | Admitting: *Deleted

## 2012-07-22 ENCOUNTER — Ambulatory Visit (HOSPITAL_BASED_OUTPATIENT_CLINIC_OR_DEPARTMENT_OTHER): Payer: BC Managed Care – PPO | Admitting: Oncology

## 2012-07-22 ENCOUNTER — Other Ambulatory Visit (HOSPITAL_BASED_OUTPATIENT_CLINIC_OR_DEPARTMENT_OTHER): Payer: BC Managed Care – PPO | Admitting: Lab

## 2012-07-22 VITALS — BP 132/77 | HR 78 | Temp 99.2°F | Resp 20 | Ht 60.5 in | Wt 146.0 lb

## 2012-07-22 DIAGNOSIS — C801 Malignant (primary) neoplasm, unspecified: Secondary | ICD-10-CM

## 2012-07-22 DIAGNOSIS — D0590 Unspecified type of carcinoma in situ of unspecified breast: Secondary | ICD-10-CM

## 2012-07-22 LAB — COMPREHENSIVE METABOLIC PANEL (CC13)
ALT: 26 U/L (ref 0–55)
Albumin: 4.2 g/dL (ref 3.5–5.0)
CO2: 29 mEq/L (ref 22–29)
Calcium: 9.9 mg/dL (ref 8.4–10.4)
Chloride: 104 mEq/L (ref 98–107)
Potassium: 4.2 mEq/L (ref 3.5–5.1)
Sodium: 140 mEq/L (ref 136–145)
Total Protein: 7 g/dL (ref 6.4–8.3)

## 2012-07-22 LAB — CBC WITH DIFFERENTIAL/PLATELET
BASO%: 0.8 % (ref 0.0–2.0)
Eosinophils Absolute: 0.2 10*3/uL (ref 0.0–0.5)
MCHC: 34.5 g/dL (ref 31.5–36.0)
MONO#: 0.4 10*3/uL (ref 0.1–0.9)
NEUT#: 3.7 10*3/uL (ref 1.5–6.5)
RBC: 3.5 10*6/uL — ABNORMAL LOW (ref 3.70–5.45)
WBC: 5.4 10*3/uL (ref 3.9–10.3)
lymph#: 1.1 10*3/uL (ref 0.9–3.3)

## 2012-07-22 NOTE — Telephone Encounter (Signed)
Made patient appointment for 07-22-2013 starting at 10:00am

## 2012-07-22 NOTE — Patient Instructions (Addendum)
Doing well no clinical evidence of recurrent disease  I will see you back on a yearly basis

## 2012-07-22 NOTE — Progress Notes (Signed)
OFFICE PROGRESS NOTE  CC  Beth Grosser, MD [address] 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 She underwent a lumpectomy with partial breast radiation at [facility].  #2 patient subsequently developed a recurrence in November 2009 again DCIS in the lateral breast. She opted for bilateral mastectomies in January 2010. The final pathology did show DCIS that was ER negative PR negative. Patient also had a prophylactic left mastectomy with immediate reconstruction.  CURRENT THERAPY: Observation  INTERVAL HISTORY: Beth Noble 56 y.o. female returns for followup visit. I have seen her on a yearly basis. Her last visit with me was on 07/10/2011. Clinically she seems to be doing well without any significant complaints. She has completed her reconstructive process by Dr. Odis Luster. 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 She is also having some GI issues and was found to have yeast infections of the GI tract she is now on probiotics. And she feels that it is getting better. She has no pain no arthralgias. Remainder of the 10 point review of systems is negative.  MEDICAL HISTORY: Past Medical History  Diagnosis Date  . Endometriosis   . Osteopenia   . Cancer     Breast cancer-Intraductal and DCIS  . Elevated cholesterol     ALLERGIES:  is allergic to codeine and morphine and related.  MEDICATIONS:  Current Outpatient Prescriptions  Medication Sig Dispense Refill  . Calcium Carbonate-Vitamin D (CALCIUM + D PO) Take by mouth.      . FLUOXETINE HCL PO Take by mouth.      . gabapentin (NEURONTIN) 300 MG capsule Take 300 mg by mouth 3 (three) times daily.      . Probiotic Product (PROBIOTIC DAILY PO) Take 1 each by mouth.      Marland Kitchen  VITAMIN E PO Take by mouth.        SURGICAL HISTORY:  Past Surgical History  Procedure Date  . Varicose veins   . Herniated disc     Neck  . Pelvic laparoscopy   . Breast surgery     Bilateral Mastectomy and reconstruction  . Breast surgery     lumpectomy  . Vaginal hysterectomy   . Oophorectomy 2007    RSO and LSO    REVIEW OF SYSTEMS:  Pertinent items are noted in HPI.   HEALTH MAINTENANCE:  Mammogram Colonoscopy Bone  Scan Pap Smear Eye Exam Vitamin D Lipid Panel  PHYSICAL EXAMINATION: Blood pressure 132/77, pulse 78, temperature 99.2 F (37.3 C), temperature source Oral, resp. rate 20, height 5' 0.5" (1.537 m), weight 146 lb (66.225 kg). Body mass index is 28.04 kg/(m^2). ECOG PERFORMANCE STATUS: 0 - Asymptomatic   General appearance: alert, cooperative and appears stated age Lymph nodes: Cervical, supraclavicular, and axillary nodes normal. Resp: clear to auscultation bilaterally Back: symmetric, no curvature. ROM normal. No CVA tenderness. Cardio: regular rate and rhythm GI: soft, non-tender; bowel sounds normal; no masses,  no organomegaly Extremities: extremities normal, atraumatic, no cyanosis or edema Neurologic: Grossly normal   LABORATORY DATA: Lab Results  Component Value Date   WBC 5.4 07/22/2012   HGB 12.2 07/22/2012   HCT 35.5 07/22/2012   MCV 101.5* 07/22/2012   PLT 200 07/22/2012      Chemistry  Component Value Date/Time   NA 140 07/10/2011 1259   K 4.6 07/10/2011 1259   CL 99 07/10/2011 1259   CO2 29 07/10/2011 1259   BUN 14 07/10/2011 1259   CREATININE 0.69 07/10/2011 1259      Component Value Date/Time   CALCIUM 10.6* 07/10/2011 1259   ALKPHOS 62 07/10/2011 1259   AST 28 07/10/2011 1259   ALT 29 07/10/2011 1259   BILITOT 0.6 07/10/2011 1259       RADIOGRAPHIC STUDIES:  No results found.  ASSESSMENT: 56 year old female with history of DCIS of the right breast. She is status post bilateral mastectomies with  reconstruction. Her tumor originally was ER negative so therefore no antiestrogen therapy was given. She seems to be without any clinical evidence of recurrent disease.   PLAN: I will continue to see her once a year. However if she needs to be seen sooner I certainly will be available to her.   All questions were answered. The patient knows to call the clinic with any problems, questions or concerns. We can certainly see the patient much sooner if necessary.  I spent 15 minutes counseling the patient face to face. The total time spent in the appointment was 30 minutes.    Drue Second, MD Medical/Oncology [facility] [phone] (beeper) [phone] (Office)  07/22/2012, 11:07 AM

## 2012-10-23 ENCOUNTER — Other Ambulatory Visit: Payer: Self-pay | Admitting: Gastroenterology

## 2012-10-23 DIAGNOSIS — R109 Unspecified abdominal pain: Secondary | ICD-10-CM

## 2012-10-27 ENCOUNTER — Ambulatory Visit
Admission: RE | Admit: 2012-10-27 | Discharge: 2012-10-27 | Disposition: A | Discharge status: Home / Self Care | Payer: BC Managed Care – PPO | Source: Ambulatory Visit | Attending: Gastroenterology | Admitting: Gastroenterology

## 2012-10-27 DIAGNOSIS — R109 Unspecified abdominal pain: Secondary | ICD-10-CM

## 2012-10-27 MED ORDER — IOHEXOL 300 MG/ML  SOLN
100.0000 mL | Freq: Once | INTRAMUSCULAR | Status: AC | PRN
  Administered 2012-10-27: 100 mL via INTRAVENOUS

## 2013-01-22 ENCOUNTER — Encounter: Payer: Self-pay | Admitting: Women's Health

## 2013-01-28 ENCOUNTER — Ambulatory Visit (INDEPENDENT_AMBULATORY_CARE_PROVIDER_SITE_OTHER): Payer: BC Managed Care – PPO | Admitting: Women's Health

## 2013-01-28 ENCOUNTER — Encounter: Payer: Self-pay | Admitting: Women's Health

## 2013-01-28 VITALS — BP 112/72 | Ht 61.0 in | Wt 144.0 lb

## 2013-01-28 DIAGNOSIS — M899 Disorder of bone, unspecified: Secondary | ICD-10-CM

## 2013-01-28 DIAGNOSIS — C801 Malignant (primary) neoplasm, unspecified: Secondary | ICD-10-CM

## 2013-01-28 DIAGNOSIS — M8580 Other specified disorders of bone density and structure, unspecified site: Secondary | ICD-10-CM

## 2013-01-28 DIAGNOSIS — M949 Disorder of cartilage, unspecified: Secondary | ICD-10-CM

## 2013-01-28 DIAGNOSIS — Z01419 Encounter for gynecological examination (general) (routine) without abnormal findings: Secondary | ICD-10-CM

## 2013-01-28 NOTE — Progress Notes (Signed)
Beth Noble 09/20/1956 [id number]    History:    The patient presents for annual exam with complaint of libido. Osteopenia, T-score -0.8 at left feoral neck, FRAX 5%/0.2%. TVH supracervical for fibroid uterus, endometriosis and menorrhagia 2004. Cervical stump with BSO 2010 at time of bilateral mastectomy.  Lumpectomy - Breast Cancer 2008. Bilateral mastectomy with reconstruction for  intraductal/DCIS, estogen/progesterone receptor negative breast cancer (2010).GI for stomach issues with improvement of symptoms - negative  Abd/pelvic CT 10/2012. First colonoscopy a benign polyp, second colonoscopy in 2009 negative, due to repeat in 2015. Testosterone cream no help with libido. Normal pap history.    Past medical history, past surgical history, family history and social history were all reviewed and documented in the EPIC chart. Does not work outside the home, originally from [place]. Two sons, one 18 lives in [place] with well controled Type 1 diabetes, another in [place]. Sister-melanoma (face). Mother- a fib.    ROS:  A  ROS was performed and pertinent positives and negatives are included in the history.  Exam:  Filed Vitals:   01/28/13 1419  BP: 112/72    General appearance:  Normal Head/Neck:  Normal, without cervical or supraclavicular adenopathy. Thyroid:  Symmetrical, normal in size, without palpable masses or nodularity. Respiratory  Effort:  Normal  Auscultation:  Clear without wheezing or rhonchi Cardiovascular  Auscultation:  Regular rate, without rubs, murmurs or gallops  Edema/varicosities:  Not grossly evident Abdominal  Soft,nontender, without masses, guarding or rebound.  Liver/spleen:  No organomegaly noted  Hernia:  None appreciated  Skin  Inspection:  Grossly normal  Palpation:  Grossly normal Neurologic/psychiatric  Orientation:  Normal with appropriate conversation.  Mood/affect:  Normal  Genitourinary    Breasts:declines exam, bilateral  mastectomy with reconstruction   Inguinal/mons:  Normal without inguinal adenopathy  External genitalia:  Normal  BUS/Urethra/Skene's glands:  Normal  Bladder:  Normal  Vagina:  atrophic  Cervix:  absent  Uterus:  absent  Adnexa/parametria:     Rt: Without masses or tenderness.   Lt: Without masses or tenderness.  Anus and perineum: Normal  Digital rectal exam: Normal sphincter tone without palpated masses or tenderness  Assessment/Plan:  57 y.o. MWF G2P2  for annual exam with complained of decreased libido.  Bilateral mastectomy for intraductal/DCIS ER/PR receptor negative-2010 TVH with BSO endometriosis/fibroids Atrophic vaginitis Osteopenia  Plan:  Home hemocult card. DEXA this year or next here.Continue lubricant use with intercourse. Calcium rich diet, vitamin D 2000 mg daily.labs at primary care. Normal Pap 2012, new screening guidelines reviewed. Home safety and fall prevention discussed. Libido discussed, declines continuing testosterone cream.  Harrington Challenger WHNP, 3:12 PM 01/28/2013

## 2013-01-28 NOTE — Patient Instructions (Addendum)

## 2013-01-29 ENCOUNTER — Telehealth: Payer: Self-pay | Admitting: *Deleted

## 2013-01-29 ENCOUNTER — Encounter: Payer: Self-pay | Admitting: Obstetrics and Gynecology

## 2013-01-29 NOTE — Telephone Encounter (Signed)
PT IS CURRENTLY TAKING CALCIUM WITH VIT. D 3 600 MG 2 PO DAILY. PT SAID SHE  YOU TOLD HER NOT TO WORRY ABOUT THE CALCIUM AND EAT CALCIUM FOODS AND TAKE VIT.D 200 UNITS. SHE IS NOT SURE HOW MUCH VIT.D IS IN THE PILLS ABOVE. SHE ALSO TAKES VIT.E 400 UNITS 2 PO DAILY FOR HEART HEALTH AS WELL. SHE HAS BEEN ON BOTH PILLS FOR YEARS. SHE ASKED IF THE ABOVE IS ENOUGH? OR SHOULD SHE JUST BY A SEPARATE VIT.D 2000 UNITS. PLEASE ADVISE

## 2013-01-29 NOTE — Telephone Encounter (Signed)
Telephone call, message left calcium supplements have 600 mg calcium and vitamin D 800 in each tablet. Recommended is 2000 of vitamin D daily. Call if questions

## 2013-03-17 ENCOUNTER — Encounter: Payer: Self-pay | Admitting: Family Medicine

## 2013-03-17 ENCOUNTER — Ambulatory Visit (INDEPENDENT_AMBULATORY_CARE_PROVIDER_SITE_OTHER): Payer: BC Managed Care – PPO | Admitting: Family Medicine

## 2013-03-17 VITALS — BP 110/80 | HR 78 | Temp 99.0°F | Resp 18 | Ht 61.0 in | Wt 144.0 lb

## 2013-03-17 DIAGNOSIS — H9203 Otalgia, bilateral: Secondary | ICD-10-CM

## 2013-03-17 DIAGNOSIS — H9209 Otalgia, unspecified ear: Secondary | ICD-10-CM

## 2013-03-17 MED ORDER — ANTIPYRINE-BENZOCAINE 5.4-1.4 % OT SOLN: 3.0000 [drp] | Freq: Four times a day (QID) | OTIC | Status: DC | PRN

## 2013-03-17 NOTE — Patient Instructions (Signed)
Use the ear drop for the next week as needed Call if not improved and you will be referred to ENT

## 2013-03-18 ENCOUNTER — Encounter: Payer: Self-pay | Admitting: Family Medicine

## 2013-03-18 DIAGNOSIS — H9203 Otalgia, bilateral: Secondary | ICD-10-CM | POA: Insufficient documentation

## 2013-03-18 NOTE — Progress Notes (Signed)
  Subjective:    Patient ID: Beth Noble, female    DOB: 19-Nov-1955, 57 y.o.   MRN: [id number]  HPI  Pt here with bilateral ear pain for past 2 weeks, seems to jump from side to side. Sharp pain in ear and in front of ear. Denies any drainage. Hearing in tact, a few times it was muffled. Denies nasal drainage, cough, headache, tooth pain. Used decongest for 10 days without any change in symptoms. Ibuprofen helped some. She was told she grinds her teeth at night. In AM she is unable to completely close her mouth feels stiff. Seen by dentist recently with good evaluation   Review of Systems  - per above  GEN- denies fatigue, fever, weight loss,weakness, recent illness HEENT- denies eye drainage, change in vision, nasal discharge, Neuro- denies headache, dizziness, syncope, seizure activity       Objective:   Physical Exam   GEN- NAD, alert and oriented x3 HEENT- PERRL, EOMI, non injected sclera, pink conjunctiva, MMM, oropharynx clear TM clear bilat no effusion,canals clear no maxillary sinus tenderness, narrow passages in nares but clear. No abscess of mouth, no pain over TMJ with palpation Neck- Supple, no LAD +        Assessment & Plan:

## 2013-03-18 NOTE — Assessment & Plan Note (Signed)
Benign exam, hearing in tact, no signs of sinusitis, pt has decongested for a good time now. Will hold on restarting meds Given AB/OTIC, ? Related to TMJ or grinding at night If no improvement refer to ENT, pt agreed to plan

## 2013-03-24 ENCOUNTER — Other Ambulatory Visit: Payer: Self-pay | Admitting: Family Medicine

## 2013-03-24 NOTE — Telephone Encounter (Signed)
?  ok to refill °

## 2013-03-24 NOTE — Telephone Encounter (Signed)
yes

## 2013-04-06 ENCOUNTER — Encounter: Payer: Self-pay | Admitting: Women's Health

## 2013-04-07 ENCOUNTER — Ambulatory Visit: Payer: BC Managed Care – PPO | Admitting: Family Medicine

## 2013-04-12 ENCOUNTER — Telehealth: Payer: Self-pay | Admitting: *Deleted

## 2013-04-12 NOTE — Telephone Encounter (Signed)
Message left

## 2013-04-12 NOTE — Telephone Encounter (Signed)
Pt would like to speak with you about the how much calcium and vit.d she should be taking. I told her the telephone encounter on  01/29/13. [phone]

## 2013-04-14 NOTE — Telephone Encounter (Signed)
Telephone call, reviewed vitamin D recommendations, will continue calcium supplement twice daily and add vitamin D 1000. History of TAH and BSO. Bilateral mastectomy. No HRT and reports no menopausal symptoms.

## 2013-04-29 ENCOUNTER — Telehealth: Payer: Self-pay | Admitting: Family Medicine

## 2013-04-29 DIAGNOSIS — H9209 Otalgia, unspecified ear: Secondary | ICD-10-CM

## 2013-04-29 DIAGNOSIS — M26629 Arthralgia of temporomandibular joint, unspecified side: Secondary | ICD-10-CM

## 2013-04-29 NOTE — Telephone Encounter (Signed)
Ear drops have not helped.  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  Wants referral to ENT?

## 2013-04-30 NOTE — Telephone Encounter (Signed)
Referral placed.

## 2013-05-05 ENCOUNTER — Telehealth: Payer: Self-pay | Admitting: Family Medicine

## 2013-05-05 MED ORDER — NAPROXEN 500 MG PO TABS: 500.0000 mg | ORAL_TABLET | Freq: Two times a day (BID) | ORAL | Status: DC

## 2013-05-05 NOTE — Telephone Encounter (Signed)
Have her start naprosyn twice a day with food, schedule an appt with me so we can recheck her before she sees the dentist again

## 2013-05-05 NOTE — Telephone Encounter (Signed)
Pt states no improvement with pain of TMJ after finishing gtts, going OOT next week and wants some relief, she said you told her if she has no relief after the gtts to call you. I told her she was referred to ENT and waiting on them to respond. After looking further into the referral I noticed [facility] said we needed to refer her to oral surgeon/orthdontics. She states she does not want to go see orthodontic and will just go to her family dentist. She wants to know what she should do in the mean time with the pain?

## 2013-05-06 NOTE — Telephone Encounter (Signed)
Pt is aware of message and will come in to see Dr. Jeanice Lim on Monday August 18

## 2013-05-10 ENCOUNTER — Ambulatory Visit (INDEPENDENT_AMBULATORY_CARE_PROVIDER_SITE_OTHER): Payer: BC Managed Care – PPO | Admitting: Family Medicine

## 2013-05-10 ENCOUNTER — Encounter: Payer: Self-pay | Admitting: Family Medicine

## 2013-05-10 VITALS — BP 130/80 | HR 74 | Temp 98.8°F | Ht 61.5 in | Wt 142.0 lb

## 2013-05-10 DIAGNOSIS — H9203 Otalgia, bilateral: Secondary | ICD-10-CM

## 2013-05-10 DIAGNOSIS — H9209 Otalgia, unspecified ear: Secondary | ICD-10-CM

## 2013-05-10 NOTE — Progress Notes (Signed)
  Subjective:    Patient ID: Beth Noble, female    DOB: Jun 24, 1956, 57 y.o.   MRN: [id number]  HPI  Pt here to f/u bilateral ear pain R> L that comes and goes. Evaluated in June for pain, given OB otic, thinks it helped some but then was unable to tell after restarting a second bottle of drops. Denies drainge from ear or sinus drainage, denies hearing loss. Has piercing pain in right ear and pain in front of both ears. Had a few episodes of feeling like her jaw was not properly lined and had clicking sounds. Was seen by her family dentist and given normal exam. Prescribed naprosyn over the weekend with minimal relief in pain.   Review of Systems - per above    GEN- denies fatigue, fever, weight loss,weakness, recent illness HEENT- denies eye drainage, change in vision, nasal discharge, CVS- denies chest pain, palpitations RESP- denies SOB, cough, wheeze Neuro- denies headache, dizziness, syncope, seizure activity       Objective:   Physical Exam  GEN- NAD, alert and oriented x3 HEENT- PERRL, EOMI, non injected sclera, pink conjunctiva, MMM, oropharynx clear, TM clear bilat no effusion, no maxillary sinus tenderness, nares clear , no oral or tooth abscess, TTP anterior bilateral ears R>l Neck- Supple, no LAD         Assessment & Plan:

## 2013-05-10 NOTE — Patient Instructions (Signed)
Referral to Ear Nose and Throat  Continue all other medications Stop naprosyn if not improved

## 2013-05-10 NOTE — Assessment & Plan Note (Signed)
Unclear cause, no change in hearing no ringing in ears TMJ also possibile cause of pain Will refer to ENT again for recurrent ear pain, appears to interfere with her normal activites

## 2013-05-13 ENCOUNTER — Other Ambulatory Visit: Payer: Self-pay | Admitting: Physician Assistant

## 2013-05-13 ENCOUNTER — Encounter: Payer: Self-pay | Admitting: Family Medicine

## 2013-05-13 NOTE — Telephone Encounter (Signed)
Gabapentin called in for one month patient due for routine lab office visit and lab work.  Ear drops denied.  Pt has appt with ENT today.  Letter to patient.

## 2013-07-22 ENCOUNTER — Ambulatory Visit (HOSPITAL_BASED_OUTPATIENT_CLINIC_OR_DEPARTMENT_OTHER): Payer: BC Managed Care – PPO | Admitting: Oncology

## 2013-07-22 ENCOUNTER — Encounter: Payer: Self-pay | Admitting: Oncology

## 2013-07-22 ENCOUNTER — Other Ambulatory Visit: Payer: Self-pay | Admitting: Emergency Medicine

## 2013-07-22 ENCOUNTER — Other Ambulatory Visit (HOSPITAL_BASED_OUTPATIENT_CLINIC_OR_DEPARTMENT_OTHER): Payer: BC Managed Care – PPO

## 2013-07-22 VITALS — BP 130/87 | HR 82 | Temp 98.8°F | Resp 19 | Ht 61.5 in | Wt 143.1 lb

## 2013-07-22 DIAGNOSIS — Z853 Personal history of malignant neoplasm of breast: Secondary | ICD-10-CM

## 2013-07-22 DIAGNOSIS — C801 Malignant (primary) neoplasm, unspecified: Secondary | ICD-10-CM

## 2013-07-22 DIAGNOSIS — C50919 Malignant neoplasm of unspecified site of unspecified female breast: Secondary | ICD-10-CM

## 2013-07-22 LAB — CBC WITH DIFFERENTIAL/PLATELET
BASO%: 1 % (ref 0.0–2.0)
Basophils Absolute: 0.1 10*3/uL (ref 0.0–0.1)
EOS%: 4.5 % (ref 0.0–7.0)
Eosinophils Absolute: 0.2 10*3/uL (ref 0.0–0.5)
HCT: 38.2 % (ref 34.8–46.6)
HGB: 12.8 g/dL (ref 11.6–15.9)
LYMPH%: 23.9 % (ref 14.0–49.7)
MCH: 34.3 pg — ABNORMAL HIGH (ref 25.1–34.0)
MCHC: 33.4 g/dL (ref 31.5–36.0)
MCV: 102.7 fL — ABNORMAL HIGH (ref 79.5–101.0)
MONO%: 7.8 % (ref 0.0–14.0)
NEUT%: 62.8 % (ref 38.4–76.8)
Platelets: 215 10*3/uL (ref 145–400)
lymph#: 1.3 10*3/uL (ref 0.9–3.3)

## 2013-07-22 LAB — COMPREHENSIVE METABOLIC PANEL (CC13)
AST: 20 U/L (ref 5–34)
Alkaline Phosphatase: 58 U/L (ref 40–150)
BUN: 13 mg/dL (ref 7.0–26.0)
Calcium: 10.3 mg/dL (ref 8.4–10.4)
Chloride: 103 mEq/L (ref 98–109)
Creatinine: 0.7 mg/dL (ref 0.6–1.1)
Glucose: 85 mg/dl (ref 70–140)
Potassium: 4.8 mEq/L (ref 3.5–5.1)
Total Protein: 7.6 g/dL (ref 6.4–8.3)

## 2013-07-22 NOTE — Progress Notes (Signed)
OFFICE PROGRESS NOTE  CC  Beth Antis, MD [address] 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 She underwent a lumpectomy with partial breast radiation at [facility].  #2 patient subsequently developed a recurrence in November 2009 again DCIS in the lateral breast. She opted for bilateral mastectomies in January 2010. The final pathology did show DCIS that was ER negative PR negative. Patient also had a prophylactic left mastectomy with immediate reconstruction.  CURRENT THERAPY: Observation  INTERVAL HISTORY: Beth Noble 57 y.o. female returns for followup visit. Overall patient is doing well. She still remains a little bit upset about her reconstruction. She is nonradiating haven't advised at this point in time in her life. She has no nausea or vomiting no fevers chills or night sweats. Remainder of the 10 point review of systems is negative.  MEDICAL HISTORY: Past Medical History  Diagnosis Date  . Endometriosis   . Osteopenia   . Cancer     Breast cancer-Intraductal and DCIS  . Elevated cholesterol     ALLERGIES:  is allergic to codeine and morphine and related.  MEDICATIONS:  Current Outpatient Prescriptions  Medication Sig Dispense Refill  . Calcium Carbonate-Vitamin D (CALCIUM + D PO) Take by mouth 2 (two) times daily.       . Cholecalciferol (VITAMIN D3) 1000 UNITS CAPS Take 1,000 Int'l Units by mouth 2 (two) times daily.      Marland Kitchen gabapentin (NEURONTIN) 300 MG capsule Take 300 mg by mouth 3 (three) times daily.      . hyoscyamine (LEVBID) 0.375 MG 12 hr tablet Take 0.375 mg by mouth every 12 (twelve) hours as needed for cramping.       No current facility-administered medications for this visit.    SURGICAL HISTORY:  Past Surgical History  Procedure Laterality Date  . Varicose veins    . Herniated  disc      Neck  . Pelvic laparoscopy    . Breast surgery      Bilateral Mastectomy and reconstruction  . Breast surgery      lumpectomy  . Vaginal hysterectomy    . Oophorectomy  2007    RSO and LSO    REVIEW OF SYSTEMS:  Pertinent items are noted in HPI.   HEALTH MAINTENANCE:   PHYSICAL EXAMINATION: Blood pressure 130/87, pulse 82, temperature 98.8 F (37.1 C), temperature source Oral, resp. rate 19, height 5' 1.5" (1.562 m), weight 143 lb 1.6 oz (64.91 kg), SpO2 97.00%. Body mass index is 26.6 kg/(m^2). ECOG PERFORMANCE STATUS: 0 - Asymptomatic   General appearance: alert, cooperative and appears stated age Lymph nodes: Cervical, supraclavicular, and axillary nodes normal. Resp: clear to auscultation bilaterally Back: symmetric, no curvature. ROM normal. No CVA tenderness. Cardio: regular rate and rhythm GI: soft, non-tender; bowel sounds normal; no masses,  no organomegaly Extremities: extremities normal, atraumatic, no cyanosis or edema Neurologic: Grossly normal   LABORATORY DATA: Lab Results  Component Value Date   WBC 5.4 07/22/2013   HGB 12.8 07/22/2013   HCT 38.2 07/22/2013   MCV 102.7* 07/22/2013   PLT 215 07/22/2013      Chemistry      Component Value Date/Time   NA 143 07/22/2013 1009   NA 140 07/10/2011 1259   K 4.8 07/22/2013 1009   K 4.6 07/10/2011 1259   CL 104 07/22/2012 1035  CL 99 07/10/2011 1259   CO2 27 07/22/2013 1009   CO2 29 07/10/2011 1259   BUN 13.0 07/22/2013 1009   BUN 14 07/10/2011 1259   CREATININE 0.7 07/22/2013 1009   CREATININE 0.69 07/10/2011 1259      Component Value Date/Time   CALCIUM 10.3 07/22/2013 1009   CALCIUM 10.6* 07/10/2011 1259   ALKPHOS 58 07/22/2013 1009   ALKPHOS 62 07/10/2011 1259   AST 20 07/22/2013 1009   AST 28 07/10/2011 1259   ALT 17 07/22/2013 1009   ALT 29 07/10/2011 1259   BILITOT 0.48 07/22/2013 1009   BILITOT 0.6 07/10/2011 1259       RADIOGRAPHIC STUDIES:  No results  found.  ASSESSMENT: 57 year old female with history of DCIS of the right breast. She is status post bilateral mastectomies with reconstruction. Her tumor originally was ER negative so therefore no antiestrogen therapy was given. She seems to be without any clinical evidence of recurrent disease.   PLAN: I will continue to see her once a year. However if she needs to be seen sooner I certainly will be available to her.   All questions were answered. The patient knows to call the clinic with any problems, questions or concerns. We can certainly see the patient much sooner if necessary.  I spent 15 minutes counseling the patient face to face. The total time spent in the appointment was 20 minutes.    Drue Second, MD Medical/Oncology [facility] [phone] (beeper) [phone] (Office)  07/22/2013, 11:06 AM

## 2013-07-23 ENCOUNTER — Telehealth: Payer: Self-pay | Admitting: Oncology

## 2013-07-23 NOTE — Telephone Encounter (Signed)
lvm for pt regarding to OCT 2015 appt...mailed pt avs and letter. °

## 2013-07-29 ENCOUNTER — Other Ambulatory Visit: Payer: Self-pay

## 2013-08-02 ENCOUNTER — Other Ambulatory Visit: Payer: Self-pay | Admitting: Family Medicine

## 2013-08-30 ENCOUNTER — Encounter: Payer: Self-pay | Admitting: Family Medicine

## 2013-08-30 ENCOUNTER — Ambulatory Visit (INDEPENDENT_AMBULATORY_CARE_PROVIDER_SITE_OTHER): Payer: BC Managed Care – PPO | Admitting: Family Medicine

## 2013-08-30 VITALS — BP 130/80 | HR 88 | Temp 99.1°F | Resp 20 | Ht 59.0 in | Wt 140.0 lb

## 2013-08-30 DIAGNOSIS — M549 Dorsalgia, unspecified: Secondary | ICD-10-CM

## 2013-08-30 DIAGNOSIS — G8929 Other chronic pain: Secondary | ICD-10-CM

## 2013-08-30 DIAGNOSIS — Z23 Encounter for immunization: Secondary | ICD-10-CM

## 2013-08-30 DIAGNOSIS — R300 Dysuria: Secondary | ICD-10-CM

## 2013-08-30 DIAGNOSIS — Z853 Personal history of malignant neoplasm of breast: Secondary | ICD-10-CM

## 2013-08-30 MED ORDER — GABAPENTIN 300 MG PO CAPS: ORAL_CAPSULE | ORAL | Status: DC

## 2013-08-30 NOTE — Assessment & Plan Note (Signed)
We'll continue her on gabapentin which controls her symptoms.

## 2013-08-30 NOTE — Progress Notes (Signed)
   Subjective:    Patient ID: Beth Noble, female    DOB: Dec 23, 1955, 57 y.o.   MRN: [id number]  HPI Patient here to followup medications. She's been on gabapentin for 9 years secondary to back surgery in the past. She typically takes anywhere from 3-6 tablets a day depending on her pain.  She complains of mild dysuria for the past 2 weeks. She denies any vaginal discharge or hematuria. Denies any abdominal pain.  Reviewed the last oncology noted. She does get pelvic pain on and off and also has Irribtable bowel syndrome. She is status post complete hysterectomy and nephrectomy. She is scheduled to see her gastroenterologist next week and will likely have colonoscopy  Review of Systems  GEN- denies fatigue, fever, weight loss,weakness, recent illness HEENT- denies eye drainage, change in vision, nasal discharge, CVS- denies chest pain, palpitations RESP- denies SOB, cough, wheeze ABD- denies N/V, change in stools, abd pain GU- +dysuria, denies hematuria, dribbling, incontinence MSK- + joint pain, muscle aches, injury Neuro- denies headache, dizziness, syncope, seizure activity      Objective:   Physical Exam GEN- NAD, alert and oriented x3 CVS- RRR, no murmur RESP-CTAB ABD-NABS,soft,,ND, no CVA tenderness, mild suprapubic tenderness EXT- No edema Pulses- Radial, DP- 2+        Assessment & Plan:

## 2013-08-30 NOTE — Assessment & Plan Note (Signed)
Patient unable to leave a sample today she will return with a sample and will check for urinary tract infection

## 2013-08-30 NOTE — Patient Instructions (Addendum)
Continue current medications Flu shot given F/u GI F/U 6 months for physical and labs

## 2013-08-30 NOTE — Assessment & Plan Note (Signed)
Review last oncology noted. She does occasionally get sharp pains in the areas of her mastectomy with implants. She's will monitor this for now she is also increased some activity recently

## 2013-08-31 ENCOUNTER — Other Ambulatory Visit: Payer: BC Managed Care – PPO

## 2013-08-31 ENCOUNTER — Telehealth: Payer: Self-pay | Admitting: Family Medicine

## 2013-08-31 DIAGNOSIS — R300 Dysuria: Secondary | ICD-10-CM

## 2013-08-31 LAB — URINALYSIS, ROUTINE W REFLEX MICROSCOPIC
Bilirubin Urine: NEGATIVE
Glucose, UA: NEGATIVE mg/dL
Hgb urine dipstick: NEGATIVE
Ketones, ur: NEGATIVE mg/dL
Protein, ur: NEGATIVE mg/dL
pH: 5.5 (ref 5.0–8.0)

## 2013-08-31 LAB — URINALYSIS, MICROSCOPIC ONLY: Casts: NONE SEEN

## 2013-08-31 MED ORDER — CEPHALEXIN 500 MG PO CAPS: 500.0000 mg | ORAL_CAPSULE | Freq: Four times a day (QID) | ORAL | Status: DC

## 2013-08-31 NOTE — Addendum Note (Signed)
Addended by: Milinda Antis F on: 08/31/2013 01:21 PM   Modules accepted: Orders

## 2013-08-31 NOTE — Telephone Encounter (Signed)
Pt has called back about lab results Call back number is [phone]

## 2013-08-31 NOTE — Addendum Note (Signed)
Addended by: Elvina Mattes T on: 08/31/2013 09:00 AM   Modules accepted: Orders

## 2013-09-01 NOTE — Telephone Encounter (Signed)
Pt aware of results 

## 2013-10-12 ENCOUNTER — Ambulatory Visit: Payer: BC Managed Care – PPO | Admitting: Family Medicine

## 2013-10-12 ENCOUNTER — Encounter (HOSPITAL_COMMUNITY): Payer: Self-pay | Admitting: Emergency Medicine

## 2013-10-12 ENCOUNTER — Emergency Department (INDEPENDENT_AMBULATORY_CARE_PROVIDER_SITE_OTHER)
Admission: EM | Admit: 2013-10-12 | Discharge: 2013-10-12 | Disposition: A | Discharge status: Home / Self Care | Payer: BC Managed Care – PPO | Source: Home / Self Care | Attending: Family Medicine | Admitting: Family Medicine

## 2013-10-12 DIAGNOSIS — R300 Dysuria: Secondary | ICD-10-CM

## 2013-10-12 LAB — POCT URINALYSIS DIP (DEVICE)
Bilirubin Urine: NEGATIVE
Glucose, UA: NEGATIVE mg/dL
Hgb urine dipstick: NEGATIVE
Ketones, ur: NEGATIVE mg/dL
Nitrite: NEGATIVE
Protein, ur: NEGATIVE mg/dL
SPECIFIC GRAVITY, URINE: 1.02 (ref 1.005–1.030)
Urobilinogen, UA: 0.2 mg/dL (ref 0.0–1.0)
pH: 7 (ref 5.0–8.0)

## 2013-10-12 MED ORDER — METHYLPREDNISOLONE (PAK) 4 MG PO TABS: ORAL_TABLET | ORAL | Status: DC

## 2013-10-12 MED ORDER — MINOCYCLINE HCL 100 MG PO CAPS: 100.0000 mg | ORAL_CAPSULE | Freq: Two times a day (BID) | ORAL | Status: DC

## 2013-10-12 MED ORDER — CIPROFLOXACIN HCL 250 MG PO TABS: 250.0000 mg | ORAL_TABLET | Freq: Two times a day (BID) | ORAL | Status: DC

## 2013-10-12 MED ORDER — CEFDINIR 300 MG PO CAPS: 300.0000 mg | ORAL_CAPSULE | Freq: Two times a day (BID) | ORAL | Status: DC

## 2013-10-12 NOTE — ED Provider Notes (Signed)
CSN: [id number]     Arrival date & time 10/12/13  1056 History   First MD Initiated Contact with Patient 10/12/13 1235     Chief Complaint  Patient presents with  . Urinary Tract Infection   (Consider location/radiation/quality/duration/timing/severity/associated sxs/prior Treatment) HPI Comments: 58 year old female presents complaining of dysuria. For 3 days, she has had increasingly severe urinary frequency and burning with urination. She was recently treated for a urinary tract infection in December, this got better, but started to recur. She denies any other symptoms. She took Keflex for the first UTI.  Patient is a 58 y.o. female presenting with urinary tract infection.  Urinary Tract Infection Pertinent negatives include no chest pain, no abdominal pain and no shortness of breath.    Past Medical History  Diagnosis Date  . Endometriosis   . Osteopenia   . Cancer     Breast cancer-Intraductal and DCIS  . Elevated cholesterol    Past Surgical History  Procedure Laterality Date  . Varicose veins    . Herniated disc      Neck  . Pelvic laparoscopy    . Breast surgery      Bilateral Mastectomy and reconstruction  . Breast surgery      lumpectomy  . Vaginal hysterectomy    . Oophorectomy  2007    RSO and LSO   Family History  Problem Relation Age of Onset  . Diabetes Son   . Atrial fibrillation Mother   . Heart disease Father   . Cancer Sister     skin cancer - chemotherapy   History  Substance Use Topics  . Smoking status: Former Research scientist (life sciences)  . Smokeless tobacco: Not on file  . Alcohol Use: 4.0 oz/week    8 drink(s) per week   OB History   Grav Para Term Preterm Abortions TAB SAB Ect Mult Living   2 2 2       2      Review of Systems  Constitutional: Negative for fever and chills.  Eyes: Negative for visual disturbance.  Respiratory: Negative for cough and shortness of breath.   Cardiovascular: Negative for chest pain, palpitations and leg swelling.   Gastrointestinal: Negative for nausea, vomiting and abdominal pain.  Endocrine: Negative for polydipsia and polyuria.  Genitourinary: Positive for dysuria, urgency and frequency. Negative for hematuria, vaginal discharge and vaginal pain.  Musculoskeletal: Negative for arthralgias and myalgias.  Skin: Negative for rash.  Neurological: Negative for dizziness, weakness and light-headedness.    Allergies  Codeine and Morphine and related  Home Medications   Current Outpatient Rx  Name  Route  Sig  Dispense  Refill  . calcium carbonate (OS-CAL) 600 MG TABS tablet   Oral   Take 600 mg by mouth 2 (two) times daily with a meal.         . cefdinir (OMNICEF) 300 MG capsule   Oral   Take 1 capsule (300 mg total) by mouth 2 (two) times daily.   20 capsule   0   . cephALEXin (KEFLEX) 500 MG capsule   Oral   Take 1 capsule (500 mg total) by mouth 4 (four) times daily.   20 capsule   0   . Cholecalciferol (VITAMIN D3) 1000 UNITS CAPS   Oral   Take 1,000 Int'l Units by mouth 2 (two) times daily.         . ciprofloxacin (CIPRO) 250 MG tablet   Oral   Take 1 tablet (250 mg total) by mouth every  12 (twelve) hours.   14 tablet   0   . gabapentin (NEURONTIN) 300 MG capsule      TAKE2 CAPSULES BY MOUTH 3 TIMES A DAY   180 capsule   6   . methylPREDNIsolone (MEDROL DOSPACK) 4 MG tablet      follow package directions   21 tablet   0   . minocycline (MINOCIN) 100 MG capsule   Oral   Take 1 capsule (100 mg total) by mouth 2 (two) times daily.   20 capsule   0    BP 136/85  Pulse 99  Temp(Src) 98.1 F (36.7 C) (Oral)  Resp 18  SpO2 98% Physical Exam  Nursing note and vitals reviewed. Constitutional: She is oriented to person, place, and time. Vital signs are normal. She appears well-developed and well-nourished. No distress.  HENT:  Head: Normocephalic and atraumatic.  Cardiovascular: Normal rate, regular rhythm and normal heart sounds.  Exam reveals no gallop and  no friction rub.   No murmur heard. Pulmonary/Chest: Effort normal and breath sounds normal. No respiratory distress. She has no wheezes. She has no rales.  Abdominal: Soft. Bowel sounds are normal. She exhibits no distension and no mass. There is no tenderness. There is no rebound, no guarding and no CVA tenderness.  Neurological: She is alert and oriented to person, place, and time. She has normal strength. Coordination normal.  Skin: Skin is warm and dry. No rash noted. She is not diaphoretic.  Psychiatric: She has a normal mood and affect. Judgment normal.    ED Course  Procedures (including critical care time) Labs Review Labs Reviewed  POCT URINALYSIS DIP (DEVICE) - Abnormal; Notable for the following:    Leukocytes, UA SMALL (*)    All other components within normal limits  URINE CULTURE   Imaging Review No results found.    MDM   1. Dysuria    Tx with cipro.  Culture sent.  F/U PRN      Liam Graham, PA-C 10/12/13 1333

## 2013-10-12 NOTE — ED Provider Notes (Signed)
Medical screening examination/treatment/procedure(s) were performed by resident physician or non-physician practitioner and as supervising physician I was immediately available for consultation/collaboration.   Pauline Good MD.   Billy Fischer, MD 10/12/13 [phone]

## 2013-10-12 NOTE — ED Notes (Signed)
Reports uti and treatment in December, unable to recall name of antibiotic.  After treatment, seemed to be fine for 2 weeks.  Then approximately 2 weeks ago started feeling sick, and then over the past 2 days noted significant increase in symptoms.  Currently feels pressure, burning at the end of urinary stream.

## 2013-10-12 NOTE — Discharge Instructions (Signed)

## 2013-10-13 ENCOUNTER — Ambulatory Visit: Payer: BC Managed Care – PPO | Admitting: Physician Assistant

## 2013-10-14 LAB — URINE CULTURE

## 2013-10-17 NOTE — ED Notes (Signed)
Urine culture: >100,000 colonies E. Coli.  Pt. adequately treated with Cipro per PA.   Beth Noble 10/17/2013

## 2014-01-17 ENCOUNTER — Encounter: Payer: Self-pay | Admitting: Family Medicine

## 2014-01-17 ENCOUNTER — Ambulatory Visit (INDEPENDENT_AMBULATORY_CARE_PROVIDER_SITE_OTHER): Payer: BC Managed Care – PPO | Admitting: Family Medicine

## 2014-01-17 VITALS — BP 136/78 | HR 84 | Temp 99.2°F | Resp 16 | Ht 60.0 in | Wt 143.0 lb

## 2014-01-17 DIAGNOSIS — A09 Infectious gastroenteritis and colitis, unspecified: Secondary | ICD-10-CM

## 2014-01-17 DIAGNOSIS — J069 Acute upper respiratory infection, unspecified: Secondary | ICD-10-CM

## 2014-01-17 LAB — CBC WITH DIFFERENTIAL/PLATELET
Basophils Absolute: 0 10*3/uL (ref 0.0–0.1)
Basophils Relative: 0 % (ref 0–1)
EOS ABS: 0.2 10*3/uL (ref 0.0–0.7)
Eosinophils Relative: 2 % (ref 0–5)
HEMATOCRIT: 37 % (ref 36.0–46.0)
HEMOGLOBIN: 12.4 g/dL (ref 12.0–15.0)
LYMPHS ABS: 1.5 10*3/uL (ref 0.7–4.0)
Lymphocytes Relative: 20 % (ref 12–46)
MCH: 33.5 pg (ref 26.0–34.0)
MCHC: 33.5 g/dL (ref 30.0–36.0)
MCV: 100 fL (ref 78.0–100.0)
MONO ABS: 0.5 10*3/uL (ref 0.1–1.0)
Monocytes Relative: 7 % (ref 3–12)
NEUTROS PCT: 71 % (ref 43–77)
Neutro Abs: 5.5 10*3/uL (ref 1.7–7.7)
Platelets: 290 10*3/uL (ref 150–400)
RBC: 3.7 MIL/uL — ABNORMAL LOW (ref 3.87–5.11)
RDW: 12.7 % (ref 11.5–15.5)
WBC: 7.7 10*3/uL (ref 4.0–10.5)

## 2014-01-17 MED ORDER — FLUCONAZOLE 150 MG PO TABS: 150.0000 mg | ORAL_TABLET | Freq: Once | ORAL | Status: DC

## 2014-01-17 MED ORDER — CIPROFLOXACIN HCL 500 MG PO TABS: 500.0000 mg | ORAL_TABLET | Freq: Two times a day (BID) | ORAL | Status: DC

## 2014-01-17 NOTE — Patient Instructions (Signed)
Robitussin DM  Take antibiotics as prescribed We will call with results Continue TYlenol for fever F/U as needed

## 2014-01-17 NOTE — Progress Notes (Signed)
Patient ID: Beth Noble, female   DOB: March 20, 1956, 58 y.o.   MRN: [id number]   Subjective:    Patient ID: Beth Noble, female    DOB: 03/28/1956, 58 y.o.   MRN: [id number]  Patient presents for Illness  patient here with acute illness. She recently returned from [place] where she was there for 2 weeks. Towards the end of her stay she began having upset stomach nausea and diarrhea. She's also had some upper respiratory symptoms. She's not had any emesis but continues to have a least 3-4 loose stools a day. She does have underlying irritable bowel syndrome. She is taking immodium She states that a lot of the people or sick at the resort including her husband who has the same symptoms. She thinks that she had a low-grade fever but that improved with Tylenol. She's able tolerate food now and has not had any recent emesis. She has known hemorrhoids therefore has some blood streaked stool with her multiple bowel movements. Her cough has minimal production but she has a lot of nasal congestion and ear pressure in the right side. She has been taking Sudafed which helped relieve some of this No rash, no joint pain    Review Of Systems:  GEN- denies fatigue, +fever, weight loss,weakness, recent illness HEENT- denies eye drainage, change in vision,+ nasal discharge, CVS- denies chest pain, palpitations RESP- denies SOB, +cough, wheeze ABD- + N/V, change in stools, +abd pain GU- denies dysuria, hematuria, dribbling, incontinence MSK- denies joint pain, muscle aches, injury Neuro- denies headache, dizziness, syncope, seizure activity       Objective:    BP 136/78  Pulse 84  Temp(Src) 99.2 F (37.3 C) (Oral)  Resp 16  Ht 5' (1.524 m)  Wt 143 lb (64.864 kg)  BMI 27.93 kg/m2 GEN- NAD, alert and oriented x3 HEENT- PERRL, EOMI, non injected sclera, pink conjunctiva, MMM, oropharynx clear, TM clear bilat no effusion,   +Nasal drainage  Neck- Supple, no LAD CVS- RRR, no  murmur RESP-CTAB ABD-NABS,soft,NT,ND EXT- No edema Pulses- Radial 2+         Assessment & Plan:      Problem List Items Addressed This Visit   None    Visit Diagnoses   Acute URI    -  Primary    I think this is likely an upper respiratory she may also have some underlying allergies Robitussin-DM    Relevant Medications       fluconazole (DIFLUCAN) tablet 150 mg    Traveler's diarrhea        Concern for diarrhea coming back from a foreign country were others were sick at the resort. I will check her CBC and her CMET start her on Cipro, benign exam    Relevant Medications       fluconazole (DIFLUCAN) tablet 150 mg    Other Relevant Orders       CBC with Differential       Comprehensive metabolic panel       Note: This dictation was prepared with Dragon dictation along with smaller phrase technology. Any transcriptional errors that result from this process are unintentional.

## 2014-01-19 LAB — COMPREHENSIVE METABOLIC PANEL
ALBUMIN: 4.4 g/dL (ref 3.5–5.2)
ALT: 43 U/L — ABNORMAL HIGH (ref 0–35)
AST: 42 U/L — ABNORMAL HIGH (ref 0–37)
Alkaline Phosphatase: 88 U/L (ref 39–117)
BUN: 13 mg/dL (ref 6–23)
CO2: 33 meq/L — AB (ref 19–32)
Calcium: 9.9 mg/dL (ref 8.4–10.5)
Chloride: 100 mEq/L (ref 96–112)
Creat: 0.61 mg/dL (ref 0.50–1.10)
GLUCOSE: 91 mg/dL (ref 70–99)
Potassium: 4.8 mEq/L (ref 3.5–5.3)
SODIUM: 139 meq/L (ref 135–145)
TOTAL PROTEIN: 6.9 g/dL (ref 6.0–8.3)
Total Bilirubin: 0.3 mg/dL (ref 0.2–1.2)

## 2014-02-01 ENCOUNTER — Encounter: Payer: Self-pay | Admitting: Women's Health

## 2014-02-01 ENCOUNTER — Ambulatory Visit (INDEPENDENT_AMBULATORY_CARE_PROVIDER_SITE_OTHER): Payer: BC Managed Care – PPO | Admitting: Women's Health

## 2014-02-01 ENCOUNTER — Other Ambulatory Visit (HOSPITAL_COMMUNITY)
Admission: RE | Admit: 2014-02-01 | Discharge: 2014-02-01 | Disposition: A | Discharge status: Home / Self Care | Payer: BC Managed Care – PPO | Source: Ambulatory Visit | Attending: Gynecology | Admitting: Gynecology

## 2014-02-01 VITALS — BP 112/66 | Ht 61.0 in | Wt 141.0 lb

## 2014-02-01 DIAGNOSIS — F32A Depression, unspecified: Secondary | ICD-10-CM

## 2014-02-01 DIAGNOSIS — Z01419 Encounter for gynecological examination (general) (routine) without abnormal findings: Secondary | ICD-10-CM | POA: Insufficient documentation

## 2014-02-01 DIAGNOSIS — F3289 Other specified depressive episodes: Secondary | ICD-10-CM

## 2014-02-01 DIAGNOSIS — F329 Major depressive disorder, single episode, unspecified: Secondary | ICD-10-CM

## 2014-02-01 MED ORDER — SERTRALINE HCL 50 MG PO TABS: 50.0000 mg | ORAL_TABLET | Freq: Every day | ORAL | Status: DC

## 2014-02-01 NOTE — Patient Instructions (Signed)

## 2014-02-01 NOTE — Progress Notes (Signed)
Beth Noble 25-Jan-1956 [id number]    History:    Presents for annual exam.  Supracervical TVH for fibroids and endometriosis. 2009 right breast cancer DCIS . 2010 BSO and bilateral mastectomies for intraductal breast cancer ER/PR negative. . 2009 benign colon polyps is scheduled for colonoscopy 03/2014. Normal Pap history. Biggest problem is depression seeing a counselor, has been on antidepressant in the past but would like to restart. No libido, good relationship with her husband. 2012 T score -1.6 AP spine.  Past medical history, past surgical history, family history and social history were all reviewed and documented in the EPIC chart. [place], 2 sons, 1 son lives here, one lives in [place], has type 1 diabetes and mental illness. Siblings live in [place] and in the states.  ROS:  A  12 point ROS was performed and pertinent positives and negatives are included.  Exam:  Filed Vitals:   02/01/14 1403  BP: 112/66    General appearance:  Normal Thyroid:  Symmetrical, normal in size, without palpable masses or nodularity. Respiratory  Auscultation:  Clear without wheezing or rhonchi Cardiovascular  Auscultation:  Regular rate, without rubs, murmurs or gallops  Edema/varicosities:  Not grossly evident Abdominal  Soft,nontender, without masses, guarding or rebound.  Liver/spleen:  No organomegaly noted  Hernia:  None appreciated  Skin  Inspection:  Grossly normal   Breasts: Examined lying and sitting. Bilateral mastectomies with implants     Gentitourinary   Inguinal/mons:  Normal without inguinal adenopathy  External genitalia:  Normal  BUS/Urethra/Skene's glands:  Normal  Vagina:  Normal  Cervix:  Normal  Uterus: Absent Adnexa/parametria:     Rt: Without masses or tenderness.   Lt: Without masses or tenderness.  Anus and perineum: Normal  Digital rectal exam: Normal sphincter tone without palpated masses or tenderness  Assessment/Plan:  58 y.o. MWF G2P2 for annual  exam with complaint of no desire for intercourse.  No libido 2010 BSO  bilateral mastectomies with implants intraductal  ER/PR negative 2008 breast cancer DCIS TVH supracervical for fibroids and endometriosis Depression-counselor recommended an antidepressant  Plan:. Reviewed importance of continuing counseling, Zoloft 50 mg. Used in the past with good relief. Half tablet daily and increase to 1 full tablet, instructed to call if no relief of symptoms discuss with counselor as well. No suicide ideation. Reviewed importance of regular exercise, calcium rich diet, vitamin D 2000 daily, campus safety fall prevention discussed. Repeat DEXA, will schedule. Labs at primary care, Pap, Pap normal 2012, new screening guidelines reviewed. Keep scheduled colonoscopy appointment.     Huel Cote [facility], 2:55 PM 02/01/2014

## 2014-02-02 LAB — URINALYSIS W MICROSCOPIC + REFLEX CULTURE
Bilirubin Urine: NEGATIVE
Casts: NONE SEEN
Crystals: NONE SEEN
GLUCOSE, UA: NEGATIVE mg/dL
Hgb urine dipstick: NEGATIVE
Ketones, ur: NEGATIVE mg/dL
LEUKOCYTES UA: NEGATIVE
Nitrite: NEGATIVE
PROTEIN: NEGATIVE mg/dL
SQUAMOUS EPITHELIAL / LPF: NONE SEEN
Specific Gravity, Urine: 1.011 (ref 1.005–1.030)
UROBILINOGEN UA: 0.2 mg/dL (ref 0.0–1.0)
pH: 7 (ref 5.0–8.0)

## 2014-02-03 ENCOUNTER — Other Ambulatory Visit: Payer: BC Managed Care – PPO

## 2014-02-03 ENCOUNTER — Telehealth: Payer: Self-pay | Admitting: Family Medicine

## 2014-02-03 DIAGNOSIS — R740 Nonspecific elevation of levels of transaminase and lactic acid dehydrogenase [LDH]: Principal | ICD-10-CM

## 2014-02-03 DIAGNOSIS — R7402 Elevation of levels of lactic acid dehydrogenase (LDH): Secondary | ICD-10-CM

## 2014-02-03 LAB — COMPLETE METABOLIC PANEL WITH GFR
ALBUMIN: 4.5 g/dL (ref 3.5–5.2)
ALT: 20 U/L (ref 0–35)
AST: 18 U/L (ref 0–37)
Alkaline Phosphatase: 62 U/L (ref 39–117)
BUN: 14 mg/dL (ref 6–23)
CALCIUM: 9.6 mg/dL (ref 8.4–10.5)
CHLORIDE: 102 meq/L (ref 96–112)
CO2: 30 mEq/L (ref 19–32)
Creat: 0.68 mg/dL (ref 0.50–1.10)
GFR, Est African American: >89 mL/min
GLUCOSE: 98 mg/dL (ref 70–99)
POTASSIUM: 4.8 meq/L (ref 3.5–5.3)
Sodium: 142 mEq/L (ref 135–145)
Total Bilirubin: 0.4 mg/dL (ref 0.2–1.2)
Total Protein: 6.5 g/dL (ref 6.0–8.3)

## 2014-02-03 NOTE — Telephone Encounter (Signed)
[phone]  [facility] [address]  Pt is needing a prescription for shingle vaccine

## 2014-02-04 MED ORDER — UNABLE TO FIND: 1.0000 | Freq: Once | Status: DC

## 2014-02-04 NOTE — Telephone Encounter (Signed)
RX printed and faxed to pharmacy.  Pt aware

## 2014-02-04 NOTE — Telephone Encounter (Signed)
Write out script and I will sign

## 2014-02-07 ENCOUNTER — Other Ambulatory Visit: Payer: Self-pay | Admitting: Gynecology

## 2014-02-07 DIAGNOSIS — M8580 Other specified disorders of bone density and structure, unspecified site: Secondary | ICD-10-CM

## 2014-02-21 DIAGNOSIS — M810 Age-related osteoporosis without current pathological fracture: Secondary | ICD-10-CM

## 2014-02-21 HISTORY — DX: Age-related osteoporosis without current pathological fracture: M81.0

## 2014-02-22 ENCOUNTER — Other Ambulatory Visit: Payer: Self-pay | Admitting: Gynecology

## 2014-02-22 ENCOUNTER — Ambulatory Visit (INDEPENDENT_AMBULATORY_CARE_PROVIDER_SITE_OTHER): Payer: BC Managed Care – PPO

## 2014-02-22 DIAGNOSIS — M8580 Other specified disorders of bone density and structure, unspecified site: Secondary | ICD-10-CM

## 2014-02-22 DIAGNOSIS — M810 Age-related osteoporosis without current pathological fracture: Secondary | ICD-10-CM

## 2014-03-01 ENCOUNTER — Telehealth: Payer: Self-pay | Admitting: Gynecology

## 2014-03-01 NOTE — Telephone Encounter (Signed)
Asked patient to make an appointment to discuss her bone density. 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 I would like to talk to her about treatment options.

## 2014-03-01 NOTE — Telephone Encounter (Signed)
Pt informed, will have front desk call patient.

## 2014-03-28 ENCOUNTER — Ambulatory Visit (INDEPENDENT_AMBULATORY_CARE_PROVIDER_SITE_OTHER): Payer: BC Managed Care – PPO | Admitting: Gynecology

## 2014-03-28 ENCOUNTER — Encounter: Payer: Self-pay | Admitting: Gynecology

## 2014-03-28 DIAGNOSIS — R6882 Decreased libido: Secondary | ICD-10-CM

## 2014-03-28 DIAGNOSIS — M810 Age-related osteoporosis without current pathological fracture: Secondary | ICD-10-CM

## 2014-03-28 MED ORDER — ALENDRONATE SODIUM 70 MG PO TABS: 70.0000 mg | ORAL_TABLET | ORAL | Status: DC

## 2014-03-28 NOTE — Patient Instructions (Signed)
Start on alendronate (Fosamax) as we discussed. Call me if you have any issues with this.  Alendronate tablets What is this medicine? ALENDRONATE (a LEN droe nate) slows calcium loss from bones. It helps to make normal healthy bone and to slow bone loss in people with Paget's disease and osteoporosis. It may be used in others at risk for bone loss. This medicine may be used for other purposes; ask your health care provider or pharmacist if you have questions. COMMON BRAND NAME(S): Fosamax What should I tell my health care provider before I take this medicine? They need to know if you have any of these conditions: -dental disease -esophagus, stomach, or intestine problems, like acid reflux or GERD -kidney disease -low blood calcium -low vitamin D -problems sitting or standing 30 minutes -trouble swallowing -an unusual or allergic reaction to alendronate, other medicines, foods, dyes, or preservatives -pregnant or trying to get pregnant -breast-feeding How should I use this medicine? You must take this medicine exactly as directed or you will lower the amount of the medicine you absorb into your body or you may cause yourself harm. Take this medicine by mouth first thing in the morning, after you are up for the day. Do not eat or drink anything before you take your medicine. Swallow the tablet with a full glass (6 to 8 fluid ounces) of plain water. Do not take this medicine with any other drink. Do not chew or crush the tablet. After taking this medicine, do not eat breakfast, drink, or take any medicines or vitamins for at least 30 minutes. Sit or stand up for at least 30 minutes after you take this medicine; do not lie down. Do not take your medicine more often than directed. Talk to your pediatrician regarding the use of this medicine in children. Special care may be needed. Overdosage: If you think you have taken too much of this medicine contact a poison control center or emergency room at  once. NOTE: This medicine is only for you. Do not share this medicine with others. What if I miss a dose? If you miss a dose, do not take it later in the day. Continue your normal schedule starting the next morning. Do not take double or extra doses. What may interact with this medicine? -aluminum hydroxide -antacids -aspirin -calcium supplements -drugs for inflammation like ibuprofen, naproxen, and others -iron supplements -magnesium supplements -vitamins with minerals This list may not describe all possible interactions. Give your health care provider a list of all the medicines, herbs, non-prescription drugs, or dietary supplements you use. Also tell them if you smoke, drink alcohol, or use illegal drugs. Some items may interact with your medicine. What should I watch for while using this medicine? Visit your doctor or health care professional for regular checks ups. It may be some time before you see benefit from this medicine. Do not stop taking your medicine except on your doctor's advice. Your doctor or health care professional may order blood tests and other tests to see how you are doing. You should make sure you get enough calcium and vitamin D while you are taking this medicine, unless your doctor tells you not to. Discuss the foods you eat and the vitamins you take with your health care professional. Some people who take this medicine have severe bone, joint, and/or muscle pain. This medicine may also increase your risk for a broken thigh bone. Tell your doctor right away if you have pain in your upper leg or groin.  Tell your doctor if you have any pain that does not go away or that gets worse. This medicine can make you more sensitive to the sun. If you get a rash while taking this medicine, sunlight may cause the rash to get worse. Keep out of the sun. If you cannot avoid being in the sun, wear protective clothing and use sunscreen. Do not use sun lamps or tanning beds/booths. What  side effects may I notice from receiving this medicine? Side effects that you should report to your doctor or health care professional as soon as possible: -allergic reactions like skin rash, itching or hives, swelling of the face, lips, or tongue -black or tarry stools -bone, muscle or joint pain -changes in vision -chest pain -heartburn or stomach pain -jaw pain, especially after dental work -pain or trouble when swallowing -redness, blistering, peeling or loosening of the skin, including inside the mouth Side effects that usually do not require medical attention (report to your doctor or health care professional if they continue or are bothersome): -changes in taste -diarrhea or constipation -eye pain or itching -headache -nausea or vomiting -stomach gas or fullness This list may not describe all possible side effects. Call your doctor for medical advice about side effects. You may report side effects to FDA at 1-800-FDA-1088. Where should I keep my medicine? Keep out of the reach of children. Store at room temperature of 15 and 30 degrees C (59 and 86 degrees F). Throw away any unused medicine after the expiration date. NOTE: This sheet is a summary. It may not cover all possible information. If you have questions about this medicine, talk to your doctor, pharmacist, or health care provider.  2015, Elsevier/Gold Standard. (2011-03-08 08:56:09)

## 2014-03-28 NOTE — Progress Notes (Signed)
Beth Noble 1955-11-26 [id number]        57 y.o.  G2P2002 presents to discuss her most recent DEXA 02/2014 which shows osteoporosis -2.7 AP spine L3, L4. L1 and 2 were discarded due to a greater than one discrepancy from the lower vertebrae. The remainder of her measured sites were normal with the exception of a -1.1 right femoral neck. The patient also after discussion of her osteoporosis had questions about decreased libido.  Past medical history,surgical history, problem list, medications, allergies, family history and social history were all reviewed and documented in the EPIC chart.  Directed ROS with pertinent positives and negatives documented in the history of present illness/assessment and plan.   Assessment/Plan:  58 y.o. [id number] with: 1. Osteoporosis L3/L4. We'll check baseline vitamin D, TSH and PTH. Recent copies metabolic panel was normal. Options for observation versus treatment reviewed. Reviewed treatment options to include bisphosphates & Prolia.  Formulations to include weekly, monthly and yearly IV also discussed. Risks to include GERD, esophageal disease, osteonecrosis of the jaw, atypical fractures also discussed. She does have a history of breast cancer status post bilateral mastectomies. She notes that she was receptor negative. Also status post TVH BSO in the past. After lengthy discussion the patient would like to try alendronate 70 mg weekly. I reviewed proper usage with her and we'll go ahead and start this and see how she does with planned repeat DEXA in 2 years. 2. Decreased libido. Been going on for several years. Is under counseling but does not feel like it's doing any good. I reviewed the whole issue of decreased libido with aging. Complicating is her history of bilateral mastectomies also I think playing into this. Consideration for testosterone such as 2% cream reviewed. Issues of using steroid and a breast cancer survivor also discussed. She is receptor  negative per her history. I asked her the next time she sees her oncologist to ask them their opinion of using testosterone cream and for their approval if she would want to try this. Also alternative names of counselors provided to her.   Note: This document was prepared with digital dictation and possible smart phrase technology. Any transcriptional errors that result from this process are unintentional.   Anastasio Auerbach MD, 12:57 PM 03/28/2014

## 2014-03-29 LAB — VITAMIN D 25 HYDROXY (VIT D DEFICIENCY, FRACTURES): Vit D, 25-Hydroxy: 80 ng/mL (ref 30–89)

## 2014-03-29 LAB — TSH: TSH: 1.224 u[IU]/mL (ref 0.350–4.500)

## 2014-03-29 LAB — PTH, INTACT AND CALCIUM
Calcium: 10.5 mg/dL (ref 8.4–10.5)
PTH: 12.9 pg/mL — ABNORMAL LOW (ref 14.0–72.0)

## 2014-06-06 ENCOUNTER — Ambulatory Visit: Payer: BC Managed Care – PPO | Admitting: Family Medicine

## 2014-06-07 ENCOUNTER — Ambulatory Visit (INDEPENDENT_AMBULATORY_CARE_PROVIDER_SITE_OTHER): Payer: BC Managed Care – PPO | Admitting: Family Medicine

## 2014-06-07 ENCOUNTER — Encounter: Payer: Self-pay | Admitting: Family Medicine

## 2014-06-07 VITALS — BP 122/64 | HR 64 | Temp 98.7°F | Resp 14 | Ht 61.0 in | Wt 144.0 lb

## 2014-06-07 DIAGNOSIS — F329 Major depressive disorder, single episode, unspecified: Secondary | ICD-10-CM | POA: Insufficient documentation

## 2014-06-07 DIAGNOSIS — Z23 Encounter for immunization: Secondary | ICD-10-CM

## 2014-06-07 DIAGNOSIS — F3289 Other specified depressive episodes: Secondary | ICD-10-CM

## 2014-06-07 DIAGNOSIS — F32A Depression, unspecified: Secondary | ICD-10-CM

## 2014-06-07 DIAGNOSIS — F331 Major depressive disorder, recurrent, moderate: Secondary | ICD-10-CM

## 2014-06-07 MED ORDER — SERTRALINE HCL 100 MG PO TABS: 100.0000 mg | ORAL_TABLET | Freq: Every day | ORAL | Status: DC

## 2014-06-07 MED ORDER — GABAPENTIN 300 MG PO CAPS: ORAL_CAPSULE | ORAL | Status: DC

## 2014-06-07 NOTE — Patient Instructions (Signed)
Increase zoloft 100mg  once a day at bedtime Flu shot given  F/U 6 weeks

## 2014-06-07 NOTE — Progress Notes (Signed)
Patient ID: Beth Noble, female   DOB: 1956/04/12, 58 y.o.   MRN: [id number]   Subjective:    Patient ID: Beth Noble, female    DOB: November 01, 1955, 58 y.o.   MRN: [id number]  Patient presents for Discuss Antidepresant  patient here to discuss her depression. She's been on Zoloft for the past 3 months this was started by her GYN. She's had some depression surrounding her from a breast cancer and the fact that she does not have her "female organs" she finds intimacy very difficult. She's also had difficulty still with her adult son who like he is deciding her. Her sleep is also been affected. The Zoloft helps some and 50 mg but not enough. She is wondering if she is to increase the medication. She has tried melatonin fell asleep as well as Benadryl. Note she is seen a family therapist with her husband  Her newest medication otherwise is post MAC secondary to osteoporosis seen on bone density.    Review Of Systems:  GEN- denies fatigue, fever, weight loss,weakness, recent illness HEENT- denies eye drainage, change in vision, nasal discharge, CVS- denies chest pain, palpitations RESP- denies SOB, cough, wheeze ABD- denies N/V, change in stools, abd pain GU- denies dysuria, hematuria, dribbling, incontinence MSK- denies joint pain, muscle aches, injury Neuro- denies headache, dizziness, syncope, seizure activity       Objective:    BP 122/64  Pulse 64  Temp(Src) 98.7 F (37.1 C) (Oral)  Resp 14  Ht 5\' 1"  (1.549 m)  Wt 144 lb (65.318 kg)  BMI 27.22 kg/m2 GEN- NAD, alert and oriented x3 Psych- tearful discussing son, not anxious appearing, no SI, no hallucinations, normal speech        Assessment & Plan:      Problem List Items Addressed This Visit   None    Visit Diagnoses   Depression    -  Primary    Relevant Medications       sertraline (ZOLOFT) tablet       Note: This dictation was prepared with Dragon dictation along with smaller phrase  technology. Any transcriptional errors that result from this process are unintentional.

## 2014-06-07 NOTE — Assessment & Plan Note (Signed)
Increase Zoloft to100 mg at bedtime. We will have her try this at bedtime to see if this also affects her sleep. She declines any other medications at this time and I think that is reasonable regarding the sleep.

## 2014-06-18 ENCOUNTER — Telehealth: Payer: Self-pay | Admitting: Adult Health

## 2014-06-18 NOTE — Telephone Encounter (Signed)
, °

## 2014-07-19 ENCOUNTER — Ambulatory Visit (INDEPENDENT_AMBULATORY_CARE_PROVIDER_SITE_OTHER): Payer: BC Managed Care – PPO | Admitting: Family Medicine

## 2014-07-19 ENCOUNTER — Encounter: Payer: Self-pay | Admitting: Family Medicine

## 2014-07-19 VITALS — BP 126/74 | HR 68 | Temp 98.5°F | Resp 14 | Ht 61.0 in | Wt 146.0 lb

## 2014-07-19 DIAGNOSIS — F329 Major depressive disorder, single episode, unspecified: Secondary | ICD-10-CM

## 2014-07-19 DIAGNOSIS — F331 Major depressive disorder, recurrent, moderate: Secondary | ICD-10-CM

## 2014-07-19 DIAGNOSIS — F32A Depression, unspecified: Secondary | ICD-10-CM

## 2014-07-19 MED ORDER — SERTRALINE HCL 100 MG PO TABS: 100.0000 mg | ORAL_TABLET | Freq: Every day | ORAL | Status: DC

## 2014-07-19 NOTE — Patient Instructions (Signed)
Continue current medications F/U as needed  Or 4 months

## 2014-07-19 NOTE — Assessment & Plan Note (Signed)
Continue zoloft at current dose, doing well with symptoms Continue to monitor, she is to call with any concerns Continue with therapy

## 2014-07-19 NOTE — Progress Notes (Signed)
Patient ID: Beth Noble, female   DOB: 05-Oct-1955, 58 y.o.   MRN: [id number]   Subjective:    Patient ID: Beth Noble, female    DOB: 01/15/1956, 58 y.o.   MRN: [id number]  Patient presents for 6 week F/U  patient here to follow-up her mood. At her last visit I increased her Zoloft 200 mg. She is also in therapy with her husband. She states her mood has improved she does not find her some crying very much and overall the medicine is helping. She continues to have some night sweats with insomnia but is not what I try any other medication. She does use melatonin and other sleep aid over-the-counter which helps. She's not had any side effects from the increase in medication    Review Of Systems:  GEN- denies fatigue, fever, weight loss,weakness, recent illness HEENT- denies eye drainage, change in vision, nasal discharge, CVS- denies chest pain, palpitations RESP- denies SOB, cough, wheeze ABD- denies N/V, change in stools, abd pain GU- denies dysuria, hematuria, dribbling, incontinence MSK- denies joint pain, muscle aches, injury Neuro- denies headache, dizziness, syncope, seizure activity       Objective:    BP 126/74  Pulse 68  Temp(Src) 98.5 F (36.9 C) (Oral)  Resp 14  Ht 5\' 1"  (1.549 m)  Wt 146 lb (66.225 kg)  BMI 27.60 kg/m2 GEN- NAD, alert and oriented x3 Psych- normal affect and mood       Assessment & Plan:      Problem List Items Addressed This Visit   None    Visit Diagnoses   Depression    -  Primary    Relevant Medications       sertraline (ZOLOFT) tablet       Note: This dictation was prepared with Dragon dictation along with smaller phrase technology. Any transcriptional errors that result from this process are unintentional.

## 2014-07-20 ENCOUNTER — Telehealth: Payer: Self-pay | Admitting: Adult Health

## 2014-07-20 NOTE — Telephone Encounter (Signed)
, °

## 2014-07-22 ENCOUNTER — Ambulatory Visit: Payer: BC Managed Care – PPO | Admitting: Oncology

## 2014-07-22 ENCOUNTER — Other Ambulatory Visit: Payer: BC Managed Care – PPO

## 2014-07-22 ENCOUNTER — Ambulatory Visit: Payer: Self-pay | Admitting: Adult Health

## 2014-07-25 ENCOUNTER — Encounter: Payer: Self-pay | Admitting: Family Medicine

## 2014-07-27 ENCOUNTER — Ambulatory Visit (HOSPITAL_BASED_OUTPATIENT_CLINIC_OR_DEPARTMENT_OTHER): Payer: BC Managed Care – PPO | Admitting: Adult Health

## 2014-07-27 ENCOUNTER — Encounter: Payer: Self-pay | Admitting: Adult Health

## 2014-07-27 ENCOUNTER — Other Ambulatory Visit (HOSPITAL_BASED_OUTPATIENT_CLINIC_OR_DEPARTMENT_OTHER): Payer: BC Managed Care – PPO

## 2014-07-27 ENCOUNTER — Telehealth: Payer: Self-pay | Admitting: Oncology

## 2014-07-27 ENCOUNTER — Ambulatory Visit (HOSPITAL_COMMUNITY)
Admission: RE | Admit: 2014-07-27 | Discharge: 2014-07-27 | Disposition: A | Discharge status: Home / Self Care | Payer: BC Managed Care – PPO | Source: Ambulatory Visit | Attending: Adult Health | Admitting: Adult Health

## 2014-07-27 VITALS — BP 132/86 | HR 82 | Temp 98.4°F | Resp 18 | Ht 61.0 in | Wt 145.6 lb

## 2014-07-27 DIAGNOSIS — M25559 Pain in unspecified hip: Secondary | ICD-10-CM

## 2014-07-27 DIAGNOSIS — Z853 Personal history of malignant neoplasm of breast: Secondary | ICD-10-CM

## 2014-07-27 DIAGNOSIS — M25551 Pain in right hip: Secondary | ICD-10-CM | POA: Diagnosis not present

## 2014-07-27 DIAGNOSIS — M810 Age-related osteoporosis without current pathological fracture: Secondary | ICD-10-CM

## 2014-07-27 LAB — COMPREHENSIVE METABOLIC PANEL (CC13)
ALT: 35 U/L (ref 0–55)
AST: 28 U/L (ref 5–34)
Albumin: 4.3 g/dL (ref 3.5–5.0)
Alkaline Phosphatase: 68 U/L (ref 40–150)
Anion Gap: 9 mEq/L (ref 3–11)
BILIRUBIN TOTAL: 0.36 mg/dL (ref 0.20–1.20)
BUN: 15.4 mg/dL (ref 7.0–26.0)
CO2: 29 mEq/L (ref 22–29)
CREATININE: 0.7 mg/dL (ref 0.6–1.1)
Calcium: 10 mg/dL (ref 8.4–10.4)
Chloride: 103 mEq/L (ref 98–109)
Glucose: 114 mg/dl (ref 70–140)
Potassium: 3.9 mEq/L (ref 3.5–5.1)
Sodium: 140 mEq/L (ref 136–145)
Total Protein: 7.3 g/dL (ref 6.4–8.3)

## 2014-07-27 LAB — CBC WITH DIFFERENTIAL/PLATELET
BASO%: 1.1 % (ref 0.0–2.0)
Basophils Absolute: 0.1 10*3/uL (ref 0.0–0.1)
EOS%: 3.3 % (ref 0.0–7.0)
Eosinophils Absolute: 0.2 10*3/uL (ref 0.0–0.5)
HEMATOCRIT: 37.9 % (ref 34.8–46.6)
HGB: 12.4 g/dL (ref 11.6–15.9)
LYMPH%: 24.6 % (ref 14.0–49.7)
MCH: 33.5 pg (ref 25.1–34.0)
MCHC: 32.8 g/dL (ref 31.5–36.0)
MCV: 102.2 fL — ABNORMAL HIGH (ref 79.5–101.0)
MONO#: 0.4 10*3/uL (ref 0.1–0.9)
MONO%: 7.6 % (ref 0.0–14.0)
NEUT#: 3.4 10*3/uL (ref 1.5–6.5)
NEUT%: 63.4 % (ref 38.4–76.8)
PLATELETS: 222 10*3/uL (ref 145–400)
RBC: 3.71 10*6/uL (ref 3.70–5.45)
RDW: 12.9 % (ref 11.2–14.5)
WBC: 5.3 10*3/uL (ref 3.9–10.3)
lymph#: 1.3 10*3/uL (ref 0.9–3.3)

## 2014-07-27 NOTE — Patient Instructions (Signed)
You are doing well.  We will continue to see you every year.  You will see Dr. Burr Medico in one year.  Continue healthy diet, exercise and breast exams.

## 2014-07-27 NOTE — Progress Notes (Signed)
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 She underwent a lumpectomy with partial breast radiation at [facility].  #2 patient subsequently developed a recurrence in November 2009 again DCIS in the lateral breast. She opted for bilateral mastectomies in January 2010. The final pathology did show DCIS that was ER negative PR negative. Patient also had a prophylactic left mastectomy with immediate reconstruction.  CURRENT THERAPY: Observation  INTERVAL HISTORY: Beth Noble 58 y.o. female returns for followup visit. She is doing well today.  She is here for follow up of her h/o DCIS.  She does c/o some mild tenderness in the area of her latissimus reconstruction on her back.  She is seeing a therapist for her marriage and not having sexual intercourse.  She does has longstanding IBS and follows with Dr. Willey Noble.  She is taking Fosamax for her osteoporosis and is tolerating this moderately well.  She does note some mild lower back pain, that she says is in her posterior hip that started when she started taking it.  She otherwise denies new pain, unintentional weight loss, hot flashes, vaginal dryness, bladder changes, skin changes, shortness of breath or any other concerns.   MEDICAL HISTORY: Past Medical History  Diagnosis Date  . Endometriosis   . Cancer     Breast cancer-Intraductal and DCIS  . Elevated cholesterol   . IBS (irritable bowel syndrome)   . Osteoporosis 02/2014    T score of -2.7 L3, L4    ALLERGIES:  is allergic to codeine and morphine and related.  MEDICATIONS:  Current Outpatient Prescriptions  Medication Sig Dispense Refill  . alendronate (FOSAMAX) 70 MG tablet Take 1 tablet (70 mg total) by mouth once a week. Take with a full glass of water on an empty  stomach. 4 tablet 6  . calcium carbonate (OS-CAL) 600 MG TABS tablet Take 600 mg by mouth 2 (two) times daily with a meal.    . Cholecalciferol (VITAMIN D3) 1000 UNITS CAPS Take 1,000 Int'l Units by mouth 2 (two) times daily.    Marland Kitchen gabapentin (NEURONTIN) 300 MG capsule TAKE2 CAPSULES BY MOUTH 3 TIMES A DAY 180 capsule 6  . Hyoscyamine Sulfate (HYOSCYAMINE PO) Take 1-2 tablets by mouth 2 (two) times daily as needed.    . sertraline (ZOLOFT) 100 MG tablet Take 1 tablet (100 mg total) by mouth at bedtime. 30 tablet 6   No current facility-administered medications for this visit.    SURGICAL HISTORY:  Past Surgical History  Procedure Laterality Date  . Varicose veins    . Herniated disc      Neck  . Vaginal hysterectomy    . Oophorectomy  2007    RSO and LSO  . Breast surgery      Bilateral Mastectomy and reconstruction  . Breast surgery      lumpectomy  . Tubal ligation      REVIEW OF SYSTEMS:  A 10 point review of systems was conducted and is otherwise negative except for what is noted above.     HEALTH MAINTENANCE:  Mammogram: n/a Colonoscopy:  Unsure of last time Bone Density Scan: 02/22/2014, osteoporosis Pap Smear: 2015 Eye Exam: its been a long time Vitamin D Level: 2015 Lipid Panel:  unsure    PHYSICAL EXAMINATION: Blood pressure 132/86, pulse 82,  temperature 98.4 F (36.9 C), temperature source Oral, resp. rate 18, height 5\' 1"  (1.549 m), weight 145 lb 9.6 oz (66.044 kg). Body mass index is 27.53 kg/(m^2).  GENERAL: Patient is a well appearing female in no acute distress HEENT:  Sclerae anicteric.  Oropharynx clear and moist. No ulcerations or evidence of oropharyngeal candidiasis. Neck is supple.  NODES:  No cervical, supraclavicular, or axillary lymphadenopathy palpated.  BREAST EXAM:  S/p reconstruction bilaterally, no masses or abnormality noted.   LUNGS:  Clear to auscultation bilaterally.  No wheezes or rhonchi. HEART:  Regular rate and rhythm. No murmur  appreciated. ABDOMEN:  Soft, nontender.  Positive, normoactive bowel sounds. No organomegaly palpated. MSK:  No focal spinal tenderness to palpation. Full range of motion bilaterally in the upper extremities. EXTREMITIES:  No peripheral edema.   SKIN:  Clear with no obvious rashes or skin changes. No nail dyscrasia. NEURO:  Nonfocal. Well oriented.  Appropriate affect. ECOG PERFORMANCE STATUS: 0 - Asymptomatic      LABORATORY DATA: Lab Results  Component Value Date   WBC 5.3 07/27/2014   HGB 12.4 07/27/2014   HCT 37.9 07/27/2014   MCV 102.2* 07/27/2014   PLT 222 07/27/2014      Chemistry      Component Value Date/Time   NA 142 02/03/2014 1104   NA 143 07/22/2013 1009   K 4.8 02/03/2014 1104   K 4.8 07/22/2013 1009   CL 102 02/03/2014 1104   CL 104 07/22/2012 1035   CO2 30 02/03/2014 1104   CO2 27 07/22/2013 1009   BUN 14 02/03/2014 1104   BUN 13.0 07/22/2013 1009   CREATININE 0.68 02/03/2014 1104   CREATININE 0.7 07/22/2013 1009   CREATININE 0.69 07/10/2011 1259      Component Value Date/Time   CALCIUM 10.5 03/28/2014 1256   CALCIUM 10.3 07/22/2013 1009   ALKPHOS 62 02/03/2014 1104   ALKPHOS 58 07/22/2013 1009   AST 18 02/03/2014 1104   AST 20 07/22/2013 1009   ALT 20 02/03/2014 1104   ALT 17 07/22/2013 1009   BILITOT 0.4 02/03/2014 1104   BILITOT 0.48 07/22/2013 1009       RADIOGRAPHIC STUDIES:  No results found.  ASSESSMENT: 58 year old female with history of DCIS of the right breast. She is status post bilateral mastectomies with reconstruction. Her tumor originally was ER negative so therefore no antiestrogen therapy was given.   PLAN:  Beth Noble is doing well.  She has no sign of recurrence.  She will continue Fosamax for osteoporosis.  I ordered an xray of her hip due to the pain.  We will call her with the results.  I recommended healthy diet, exercise, and monthly breast exams.    She will return in one year for labs and f/u with Beth Noble.    All  questions were answered. The patient knows to call the clinic with any problems, questions or concerns. We can certainly see the patient much sooner if necessary.  I spent 25 minutes counseling the patient face to face. The total time spent in the appointment was 30 minutes.  Minette Headland, [place] [phone] 07/27/2014, 2:12 PM

## 2014-07-27 NOTE — Telephone Encounter (Signed)
, °

## 2014-08-01 ENCOUNTER — Telehealth: Payer: Self-pay | Admitting: *Deleted

## 2014-08-01 NOTE — Telephone Encounter (Signed)
-----   Message from Minette Headland, NP sent at 07/29/2014  4:25 PM EST ----- Please call patient with results.  No sign of cancer in hip.    ----- Message -----    From: Rad Results In Interface    Sent: 07/27/2014   3:37 PM      To: Minette Headland, NP

## 2014-08-01 NOTE — Telephone Encounter (Signed)
Called to inform pt of X-Ray to her Rt hip. No answer but left a detailed message concerning results and told pt if she wanted to discuss results she can reach this nurse at [phone]. Message to be forwarded to Charlestine Massed, NP.

## 2014-08-03 NOTE — Telephone Encounter (Signed)
Pt called back on 08/02/2014. I communicated with pt about the results of her X-Ray concerning the rt hip. Pt still complains of pain to the right hip and describes the pain as " I feel like a bad bruise or a  knot is there and it is achy sometimes throbbing". I consulted with Mendel Ryder and the pt will be referred to [facility] for further eval. I will set the appt and call the patient. Message to be forwarded to [place].

## 2014-08-30 ENCOUNTER — Telehealth: Payer: Self-pay | Admitting: Hematology

## 2014-10-16 ENCOUNTER — Other Ambulatory Visit: Payer: Self-pay | Admitting: Gynecology

## 2014-10-25 ENCOUNTER — Ambulatory Visit: Payer: Self-pay | Admitting: Family Medicine

## 2014-10-26 ENCOUNTER — Encounter: Payer: Self-pay | Admitting: Family Medicine

## 2014-10-26 ENCOUNTER — Ambulatory Visit (INDEPENDENT_AMBULATORY_CARE_PROVIDER_SITE_OTHER): Payer: BLUE CROSS/BLUE SHIELD | Admitting: Family Medicine

## 2014-10-26 VITALS — BP 126/68 | HR 68 | Temp 99.1°F | Resp 16 | Ht 61.0 in | Wt 147.0 lb

## 2014-10-26 DIAGNOSIS — F331 Major depressive disorder, recurrent, moderate: Secondary | ICD-10-CM

## 2014-10-26 DIAGNOSIS — F329 Major depressive disorder, single episode, unspecified: Secondary | ICD-10-CM

## 2014-10-26 DIAGNOSIS — F32A Depression, unspecified: Secondary | ICD-10-CM

## 2014-10-26 MED ORDER — SERTRALINE HCL 100 MG PO TABS
ORAL_TABLET | ORAL | Status: DC
Start: 2014-10-26 — End: 2014-12-07

## 2014-10-26 NOTE — Patient Instructions (Signed)
Increase the zoloft to 1 and 1/2 tablet ( total 150mg )  Call for any concerns F/U 6 weeks

## 2014-10-26 NOTE — Progress Notes (Signed)
Patient ID: Beth Noble, female   DOB: [birth year], 59 y.o.   MRN: [id number]   Subjective:    Patient ID: Beth Noble, female    DOB: Feb 19, 1956, 59 y.o.   MRN: [id number]  Patient presents for Medication Management  patient here to discuss her depression. She is currently on Zoloft 100 mg at bedtime. She is followed by a therapist as she has been seen for a year her husband also physical therapy at times. She's had a lot of depression and sadness surrounding her son distancing himself from the family. Over year ago less than misunderstanding about him getting money and since then he has not spoken to anyone in the family, it has now been over a year since she has had contact. Her son "Marylyn Ishihara" is 33 years old and lives in [place]. She worries everyday about his health not sure if he is alive or not.  She has tried reaching out but he refuses to have anything to do with the family and this depresses her daily. She finds herself crying all the time. She is trying meditation as well as medications and she does well for a while however over the past month or so she's had increased depressive symptoms  due to the holidays being this without her son being around as well as the anniversary of her brothers and her son's birthday is coming up and she will not be able to spend it with him. The Entire family is actually very distraught over the eldest son no longer being apart. In general she sleeps well she has a OTC sleep Aide .  She has some support of church members as well    Review Of Systems:  GEN- denies fatigue, fever, weight loss,weakness, recent illness HEENT- denies eye drainage, change in vision, nasal discharge, CVS- denies chest pain, palpitations RESP- denies SOB, cough, wheeze ABD- denies N/V, change in stools, abd pain GU- denies dysuria, hematuria, dribbling, incontinence MSK- denies joint pain, muscle aches, injury Neuro- denies headache, dizziness, syncope, seizure  activity       Objective:    BP 126/68 mmHg  Pulse 68  Temp(Src) 99.1 F (37.3 C) (Oral)  Resp 16  Ht 5\' 1"  (1.549 m)  Wt 147 lb (66.679 kg)  BMI 27.79 kg/m2 GEN- NAD, alert and oriented x3 Psych- Depressed affect, crying during visit, no SI, normal speech, normal judgment and thought process, well groomed        Assessment & Plan:      Problem List Items Addressed This Visit      Unprioritized   MDD (major depressive disorder)   Relevant Medications   sertraline (ZOLOFT) tablet    Other Visit Diagnoses    Depression    -  Primary    Relevant Medications    sertraline (ZOLOFT) tablet       Note: This dictation was prepared with Dragon dictation along with smaller phrase technology. Any transcriptional errors that result from this process are unintentional.

## 2014-10-26 NOTE — Assessment & Plan Note (Signed)
increase  Zoloft  To 150 mg and see how she does with this change in her symptoms then we will taper her off and try Brintillex  Continue with therapy Call for any concerns with medication F/U 6 weeks

## 2014-11-23 ENCOUNTER — Institutional Professional Consult (permissible substitution): Payer: BLUE CROSS/BLUE SHIELD | Admitting: Gynecology

## 2014-12-07 ENCOUNTER — Encounter: Payer: Self-pay | Admitting: Family Medicine

## 2014-12-07 ENCOUNTER — Ambulatory Visit (INDEPENDENT_AMBULATORY_CARE_PROVIDER_SITE_OTHER): Payer: BLUE CROSS/BLUE SHIELD | Admitting: Family Medicine

## 2014-12-07 VITALS — BP 120/74 | HR 74 | Temp 99.1°F | Resp 16 | Ht 61.0 in | Wt 145.0 lb

## 2014-12-07 DIAGNOSIS — F329 Major depressive disorder, single episode, unspecified: Secondary | ICD-10-CM | POA: Diagnosis not present

## 2014-12-07 DIAGNOSIS — I781 Nevus, non-neoplastic: Secondary | ICD-10-CM

## 2014-12-07 DIAGNOSIS — F32A Depression, unspecified: Secondary | ICD-10-CM

## 2014-12-07 DIAGNOSIS — F331 Major depressive disorder, recurrent, moderate: Secondary | ICD-10-CM

## 2014-12-07 MED ORDER — SERTRALINE HCL 100 MG PO TABS: ORAL_TABLET | ORAL | Status: DC

## 2014-12-07 NOTE — Patient Instructions (Signed)
Continue current dose of medication Referral to skin specialist  F/U 3 months

## 2014-12-07 NOTE — Assessment & Plan Note (Signed)
We will continue the Zoloft at 150 mg once a day she seems to be doing well with this she will also continue with her therapist. She will let me know if she has any problems with the medication.

## 2014-12-07 NOTE — Progress Notes (Signed)
Patient ID: Beth Noble, female   DOB: 09-30-1955, 59 y.o.   MRN: [id number]   Subjective:    Patient ID: Beth Noble, female    DOB: 1956/03/03, 59 y.o.   MRN: [id number]  Patient presents for Follow-up   patient to follow-up medications. Her last visit I increased her Zoloft  To 150 mg. She states she can tell a different with this and feels much happier and she is not crying nowhere near as much she was previously. Her therapist is also noticed a difference in her mood as well as her husband. She is out doing things that she enjoys and is exercising also to keep her busy. She is sleeping well.  She is concerned about some cosmetic vessels on her face. She's had a vessel on her lip which has been laser couple times but it is still present she now also has some veins that popped up on her cheek in her nose that she is concerned about.    Review Of Systems:  GEN- denies fatigue, fever, weight loss,weakness, recent illness HEENT- denies eye drainage, change in vision, nasal discharge, CVS- denies chest pain, palpitations RESP- denies SOB, cough, wheeze ABD- denies N/V, change in stools, abd pain GU- denies dysuria, hematuria, dribbling, incontinence MSK- denies joint pain, muscle aches, injury Neuro- denies headache, dizziness, syncope, seizure activity       Objective:    BP 120/74 mmHg  Pulse 74  Temp(Src) 99.1 F (37.3 C) (Oral)  Resp 16  Ht 5\' 1"  (1.549 m)  Wt 145 lb (65.772 kg)  BMI 27.41 kg/m2 GEN- NAD, alert and oriented x3 Psych- normal affect and mood, no SI, normal speech, well groomed, normal thought process,happy toda Skin- broken vessel on cheek, small vein on end on nose and upper left lip Scattered brown macules, face, arms        Assessment & Plan:      Problem List Items Addressed This Visit      Unprioritized   MDD (major depressive disorder)   Relevant Medications   sertraline (ZOLOFT) tablet    Other Visit Diagnoses    Depression    -  Primary    Relevant Medications    sertraline (ZOLOFT) tablet       Note: This dictation was prepared with Dragon dictation along with smaller phrase technology. Any transcriptional errors that result from this process are unintentional.

## 2014-12-07 NOTE — Assessment & Plan Note (Signed)
Refer to derm for 2nd opinion and with new lesions on face

## 2014-12-26 ENCOUNTER — Encounter: Payer: Self-pay | Admitting: Gynecology

## 2014-12-26 ENCOUNTER — Ambulatory Visit (INDEPENDENT_AMBULATORY_CARE_PROVIDER_SITE_OTHER): Payer: BLUE CROSS/BLUE SHIELD | Admitting: Gynecology

## 2014-12-26 VITALS — BP 120/70

## 2014-12-26 DIAGNOSIS — M810 Age-related osteoporosis without current pathological fracture: Secondary | ICD-10-CM | POA: Diagnosis not present

## 2014-12-26 NOTE — Progress Notes (Signed)
Ron Junco 1956/05/29 [id number]        59 y.o.  [id number] Presents to discuss heartburn associated with her Fosamax.  Patient notes 1-2 days following her Fosamax she has a lot of heartburn and indigestion. Otherwise is doing well.  Started July 2015 after DEXA showed T score -2.7.  Past medical history,surgical history, problem list, medications, allergies, family history and social history were all reviewed and documented in the EPIC chart.  Directed ROS with pertinent positives and negatives documented in the history of present illness/assessment and plan.  Exam: Filed Vitals:   12/26/14 1428  BP: 120/70    Assessment/Plan:  59 y.o. [id number] with osteoporosis and unacceptable GERD associated with Fosamax. Alternatives to include trial of long-acting bisphosphonate such as Actonel or Reclast. Prolia twice yearly. Consider Evista recognizing history of breast cancer also reviewed. The pros/cons of each choice discussed. At this point we'll plan on Prolia. I reviewed risks to include osteonecrosis of the jaw, atypical fractures, rashes and infections. Patient understands and accepts and we'll move towards initiation of this. She knows to call within 2 weeks if she does not hear from my office to initiate this.    Anastasio Auerbach MD, 2:54 PM 12/26/2014

## 2014-12-26 NOTE — Patient Instructions (Signed)
Office will call you to initiate the Prolia injections. Call if you do not hear from the office within 2 weeks.

## 2014-12-28 ENCOUNTER — Telehealth: Payer: Self-pay | Admitting: Gynecology

## 2014-12-28 NOTE — Telephone Encounter (Addendum)
I want to start this patient on Prolia. She had been on Fosamax but is having unacceptable reflux. ( note Dr. Ladean Raya with pt regarding Prolia injection and explained we check benefits first. I will inform her when received.

## 2015-01-17 NOTE — Telephone Encounter (Signed)
Called and left message for patient . Benefits received from insurance  Deductible  $200 (0 met), Co insurance 10% ( $100), with ov $30 co-pay. Oral exam needed. PA needed. Approx cost for pt $300.00. Calcium level 10.0 07/27/2014, Patient may qualify for Prolia benefit card. Left message for patient to return phone call. PA form from [employer] must be filled out and mailed after patient returns phone call.

## 2015-01-24 NOTE — Telephone Encounter (Signed)
Returned pt phone call regarding Prolia. No answer left message on voicemail.

## 2015-01-25 NOTE — Telephone Encounter (Signed)
Phone call from patient left on voicemail . Pt does not want to start medication due to insurance requirements. She will continue taking Fosamax. I have tried to reach patient on phone numerous times . I have left messages with instructions on the office time to call about Prolia.

## 2015-01-26 NOTE — Telephone Encounter (Signed)
Talked with patient this morning. She does not want to change from Fosamax at this time. She will call in the event that the reflux increases or if she changes her mind.

## 2015-02-14 ENCOUNTER — Encounter: Payer: BLUE CROSS/BLUE SHIELD | Admitting: Women's Health

## 2015-02-15 ENCOUNTER — Encounter: Payer: Self-pay | Admitting: Gynecology

## 2015-02-22 ENCOUNTER — Encounter: Payer: BLUE CROSS/BLUE SHIELD | Admitting: Women's Health

## 2015-05-02 ENCOUNTER — Ambulatory Visit (INDEPENDENT_AMBULATORY_CARE_PROVIDER_SITE_OTHER): Payer: BLUE CROSS/BLUE SHIELD | Admitting: Family Medicine

## 2015-05-02 ENCOUNTER — Encounter: Payer: Self-pay | Admitting: Family Medicine

## 2015-05-02 VITALS — BP 134/74 | HR 68 | Temp 98.6°F | Resp 16 | Ht 61.0 in | Wt 147.0 lb

## 2015-05-02 DIAGNOSIS — K130 Diseases of lips: Secondary | ICD-10-CM

## 2015-05-02 MED ORDER — TRIAMCINOLONE ACETONIDE 0.1 % EX OINT: 1.0000 "application " | TOPICAL_OINTMENT | Freq: Two times a day (BID) | CUTANEOUS | Status: DC

## 2015-05-02 NOTE — Progress Notes (Signed)
Patient ID: Beth Noble, female   DOB: 1956-07-24, 59 y.o.   MRN: [id number]   Subjective:    Patient ID: Beth Noble, female    DOB: 1956-07-03, 59 y.o.   MRN: [id number]  Patient presents for Skin Irritation to Mouth  patient here with irritation to both corners of her mouth for the past couple weeks. She denies any change with her soap mouth wash to face. She does have history of fever blisters but has not had any recently. She also gets dry mouth from her medications but it is not severe. She is noticed that the cracking now causes some bleeding which is painful when she eats.  She has been using Triple Antibiotic ointment as well as over-the-counter Carmex and lip balms  She does tell me that her father passed away he was 64 years of age but the family is doing well   Review Of Systems: per above   GEN- denies fatigue, fever, weight loss,weakness, recent illness HEENT- denies eye drainage, change in vision, nasal discharge, CVS- denies chest pain, palpitations RESP- denies SOB, cough, wheeze ABD- denies N/V, change in stools, abd pain GU- denies dysuria, hematuria, dribbling, incontinence MSK- denies joint pain, muscle aches, injury Neuro- denies headache, dizziness, syncope, seizure activity       Objective:    BP 134/74 mmHg  Pulse 68  Temp(Src) 98.6 F (37 C) (Oral)  Resp 16  Ht 5\' 1"  (1.549 m)  Wt 147 lb (66.679 kg)  BMI 27.79 kg/m2 GEN- NAD, alert and oriented x3 HEENT- PERRL, EOMI, non injected sclera, pink conjunctiva, MMM, oropharynx clear- erythema and cracking both corners of mouth, TTP, no discharge noted  Neck- Supple, no LAD CVS- RRR, no murmur RESP-CTAB         Assessment & Plan:      Problem List Items Addressed This Visit    None    Visit Diagnoses    Angular cheilitis    -  Primary    Treat with topical steroid BID and petroleum if this does not clear will call dermatologist. Doubt medication causing, no sign of bacterial  infection, vitamin Def possible, but no real reason for malnutrition and no other signs        Note: This dictation was prepared with Dragon dictation along with smaller phrase technology. Any transcriptional errors that result from this process are unintentional.

## 2015-05-02 NOTE — Patient Instructions (Signed)
Use the steroid as prescribed Vaseline as in between  Call if not improved and dermatology will be scheduled F/U as needed

## 2015-05-09 ENCOUNTER — Other Ambulatory Visit: Payer: Self-pay | Admitting: Gynecology

## 2015-05-15 ENCOUNTER — Telehealth: Payer: Self-pay | Admitting: Family Medicine

## 2015-05-15 NOTE — Telephone Encounter (Signed)
Call placed to patient.   Patient made aware of MD recommendations.   States that she has dermatologist and does not need referral at this time.

## 2015-05-15 NOTE — Telephone Encounter (Signed)
Call placed to patient.   States that she has been Kenalog cream on the areas of irritation to her lips.   States that areas are improving with the use of kenalog, but her lips are peeling and becoming raw. States that she stopped using the Kenalog and is now only using Vaseline.   MD please advise.

## 2015-05-15 NOTE — Telephone Encounter (Signed)
Go ahead and stop the steroid cream, agree with vaseline for now, give this until Wed to see if this improves, if not I think she should see dermatology. See if she has one already

## 2015-05-15 NOTE — Telephone Encounter (Signed)
Patient would like to speak to you regarding the steroid medication cream she is taking, and  A possible allergic rxn  Please call back at [phone]

## 2015-05-24 ENCOUNTER — Other Ambulatory Visit: Payer: Self-pay | Admitting: Gastroenterology

## 2015-05-31 ENCOUNTER — Telehealth: Payer: Self-pay | Admitting: Family Medicine

## 2015-05-31 NOTE — Telephone Encounter (Signed)
Patient is still having pain in knuckles would like to know if dr Buelah Manis can call in the cream for arthritis that was dicussed  [phone] [facility]

## 2015-07-17 ENCOUNTER — Other Ambulatory Visit: Payer: Self-pay | Admitting: Family Medicine

## 2015-07-17 NOTE — Telephone Encounter (Signed)
Refill appropriate and filled per protocol. 

## 2015-07-27 ENCOUNTER — Other Ambulatory Visit: Payer: Self-pay | Admitting: *Deleted

## 2015-07-27 DIAGNOSIS — Z853 Personal history of malignant neoplasm of breast: Secondary | ICD-10-CM

## 2015-07-31 ENCOUNTER — Ambulatory Visit (HOSPITAL_BASED_OUTPATIENT_CLINIC_OR_DEPARTMENT_OTHER): Payer: BLUE CROSS/BLUE SHIELD | Admitting: Hematology

## 2015-07-31 ENCOUNTER — Other Ambulatory Visit (HOSPITAL_BASED_OUTPATIENT_CLINIC_OR_DEPARTMENT_OTHER): Payer: BLUE CROSS/BLUE SHIELD

## 2015-07-31 ENCOUNTER — Telehealth: Payer: Self-pay | Admitting: Hematology

## 2015-07-31 ENCOUNTER — Encounter: Payer: Self-pay | Admitting: Hematology

## 2015-07-31 VITALS — BP 135/79 | HR 71 | Temp 99.0°F | Resp 18 | Ht 61.0 in | Wt 146.6 lb

## 2015-07-31 DIAGNOSIS — Z853 Personal history of malignant neoplasm of breast: Secondary | ICD-10-CM

## 2015-07-31 LAB — CBC WITH DIFFERENTIAL/PLATELET
BASO%: 0.7 % (ref 0.0–2.0)
BASOS ABS: 0 10*3/uL (ref 0.0–0.1)
EOS%: 5.3 % (ref 0.0–7.0)
Eosinophils Absolute: 0.2 10*3/uL (ref 0.0–0.5)
HEMATOCRIT: 37.8 % (ref 34.8–46.6)
HGB: 12.6 g/dL (ref 11.6–15.9)
LYMPH#: 1 10*3/uL (ref 0.9–3.3)
LYMPH%: 21.6 % (ref 14.0–49.7)
MCH: 33.6 pg (ref 25.1–34.0)
MCHC: 33.4 g/dL (ref 31.5–36.0)
MCV: 100.6 fL (ref 79.5–101.0)
MONO#: 0.3 10*3/uL (ref 0.1–0.9)
MONO%: 6.9 % (ref 0.0–14.0)
NEUT#: 3 10*3/uL (ref 1.5–6.5)
NEUT%: 65.5 % (ref 38.4–76.8)
PLATELETS: 188 10*3/uL (ref 145–400)
RBC: 3.76 10*6/uL (ref 3.70–5.45)
RDW: 12.9 % (ref 11.2–14.5)
WBC: 4.6 10*3/uL (ref 3.9–10.3)

## 2015-07-31 LAB — COMPREHENSIVE METABOLIC PANEL (CC13)
ALT: 19 U/L (ref 0–55)
ANION GAP: 9 meq/L (ref 3–11)
AST: 19 U/L (ref 5–34)
Albumin: 4.4 g/dL (ref 3.5–5.0)
Alkaline Phosphatase: 60 U/L (ref 40–150)
BUN: 16.1 mg/dL (ref 7.0–26.0)
CALCIUM: 10.3 mg/dL (ref 8.4–10.4)
CHLORIDE: 104 meq/L (ref 98–109)
CO2: 27 mEq/L (ref 22–29)
Creatinine: 0.7 mg/dL (ref 0.6–1.1)
EGFR: >90 mL/min/{1.73_m2} (ref >90)
Glucose: 99 mg/dl (ref 70–140)
POTASSIUM: 4.2 meq/L (ref 3.5–5.1)
Sodium: 141 mEq/L (ref 136–145)
Total Bilirubin: 0.34 mg/dL (ref 0.20–1.20)
Total Protein: 7.3 g/dL (ref 6.4–8.3)

## 2015-07-31 NOTE — Progress Notes (Signed)
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 She underwent a lumpectomy with partial breast radiation at [facility].  #2 patient subsequently developed a recurrence in November 2009 again DCIS in the lateral breast. She opted for bilateral mastectomies in January 2010. The final pathology did show DCIS that was ER negative PR negative. Patient also had a prophylactic left mastectomy with immediate reconstruction.  INTERVAL HISTORY: Beth Noble is a 59 female with past medical history of recurrent DCIS, status post bilateral mastectomy, is here for follow-up. She was last seen by our nurse practitioner Beth Noble one year ago. She has been doing well clinically, she denies any significant pain, dyspnea, GI symptoms. She has good appetite and energy level. She is unhappy with her asymmetric breasts after the reconstructions, and her lack of sex drive. She has been seen by psych counselor and had a 1 year therapy, without significant improvement. She is quite stressed about that. She states that she has a good relationship with her husband.   REVIEW OF SYSTEMS:   Constitutional: Denies fevers, chills or abnormal weight loss Eyes: Denies blurriness of vision Ears, nose, mouth, throat, and face: Denies mucositis or sore throat Respiratory: Denies cough, dyspnea or wheezes Cardiovascular: Denies palpitation, chest discomfort or lower extremity swelling Gastrointestinal:  Denies nausea, heartburn or change in bowel habits Skin: Denies abnormal skin rashes Lymphatics: Denies new lymphadenopathy or easy bruising Neurological:Denies numbness, tingling or new weaknesses Behavioral/Psych: Mood is stable,  no new changes  All other systems were reviewed with the patient and are negative.  MEDICAL HISTORY:  Past Medical History  Diagnosis Date  . Endometriosis   . Cancer (HCC)     Breast cancer-Intraductal and DCIS  . Elevated cholesterol   . IBS (irritable bowel syndrome)   . Osteoporosis 02/2014    T score of -2.7 L3, L4    SURGICAL HISTORY: Past Surgical History  Procedure Laterality Date  . Varicose veins    . Herniated disc      Neck  . Vaginal hysterectomy    . Oophorectomy  2007    RSO and LSO  . Breast surgery      Bilateral Mastectomy and reconstruction  . Breast surgery      lumpectomy  . Tubal ligation      I have reviewed the social history and family history with the patient and they are unchanged from previous note.  ALLERGIES:  is allergic to codeine and morphine and related.  MEDICATIONS:  Current Outpatient Prescriptions  Medication Sig Dispense Refill  . alendronate (FOSAMAX) 70 MG tablet TAKE 1 TABLET BY MOUTH ONCE A WEEK WITH A FULL GLASS OF WATER ON AN EMPTY STOMAC 4 tablet 6  . calcium carbonate (OS-CAL) 600 MG TABS tablet Take 600 mg by mouth 2 (two) times daily with a meal.    . Cholecalciferol (VITAMIN D3) 1000 UNITS CAPS Take 1,000 Int'l Units by mouth 2 (two) times daily.    Marland Kitchen gabapentin (NEURONTIN) 300 MG capsule TAKE 2 CAPSULES BY MOUTH 3 TIMES A DAY 180 capsule 3  . sertraline (ZOLOFT) 100 MG tablet Take 1.5 tablets by mouth at bedtime 45 tablet 6   No current facility-administered medications for this visit.  PHYSICAL EXAMINATION: ECOG PERFORMANCE STATUS: 0 - Asymptomatic  Filed Vitals:   07/31/15 1342  BP: 135/79  Pulse: 71  Temp: 99 F (37.2 C)  Resp: 18   Filed Weights   07/31/15 1342  Weight: 146 lb 9.6 oz (66.497 kg)    GENERAL:alert, no distress and comfortable SKIN: skin color, texture, turgor are normal, no rashes or significant lesions EYES: normal, Conjunctiva are pink and non-injected, sclera clear OROPHARYNX:no  exudate, no erythema and lips, buccal mucosa, and tongue normal  NECK: supple, thyroid normal size, non-tender, without nodularity LYMPH:  no palpable lymphadenopathy in the cervical, axillary or inguinal LUNGS: clear to auscultation and percussion with normal breathing effort HEART: regular rate & rhythm and no murmurs and no lower extremity edema ABDOMEN:abdomen soft, non-tender and normal bowel sounds Musculoskeletal:no cyanosis of digits and no clubbing  NEURO: alert & oriented x 3 with fluent speech, no focal motor/sensory deficits  LABORATORY DATA:  I have reviewed the data as listed CBC Latest Ref Rng 07/31/2015 07/27/2014 01/17/2014  WBC 3.9 - 10.3 10e3/uL 4.6 5.3 7.7  Hemoglobin 11.6 - 15.9 g/dL 12.6 12.4 12.4  Hematocrit 34.8 - 46.6 % 37.8 37.9 37.0  Platelets 145 - 400 10e3/uL 188 222 290     CMP Latest Ref Rng 07/31/2015 07/27/2014 03/28/2014  Glucose 70 - 140 mg/dl 99 114 -  BUN 7.0 - 26.0 mg/dL 16.1 15.4 -  Creatinine 0.6 - 1.1 mg/dL 0.7 0.7 -  Sodium 136 - 145 mEq/L 141 140 -  Potassium 3.5 - 5.1 mEq/L 4.2 3.9 -  Chloride 96 - 112 mEq/L - - -  CO2 22 - 29 mEq/L 27 29 -  Calcium 8.4 - 10.4 mg/dL 10.3 10.0 10.5  Total Protein 6.4 - 8.3 g/dL 7.3 7.3 -  Total Bilirubin 0.20 - 1.20 mg/dL 0.34 0.36 -  Alkaline Phos 40 - 150 U/L 60 68 -  AST 5 - 34 U/L 19 28 -  ALT 0 - 55 U/L 19 35 -      RADIOGRAPHIC STUDIES: I have personally reviewed the radiological images as listed and agreed with the findings in the report. No results found.   ASSESSMENT & PLAN:  59 year old Caucasian female, with past medical history of recurrent right breast DCIS, status post mastectomy and left prophylactic mastectomy with reconstructions. She is here for routine follow-up.  1. Right recurrent DCIS, ER-/PR- -her DCIS was cured by computed surgical resections. -She had mastectomy, so the risk of breast cancer is minimum (but not zero). We discussed that there is less than 5% risk of S cancer  after mastectomy due to the residual breast tissues. -She is status post bilateral mastectomy, no need for mammogram. -I encouraged her to have healthy diet and exercise regularly -She prefers to follow-up with [place], we'll continue annual follow-up  2. Low libido  -she has tried vaginal estrogen, psych consult for one year, without significant improvement.  -I'll refer her to our breast cancer survivor clinic to discuss breast cancer treatment related side effects and management.  Follow up -I will see her back in 1-1.5 year with lab and exam    All questions were answered. The patient knows to call the clinic with any problems, questions or concerns. No barriers to learning was detected.  I spent 25 minutes counseling the patient face to face. The total time spent in the appointment was 30 minutes and more than 50% was on counseling and review of test results     Truitt Merle,  MD 07/31/2015 2:04 PM

## 2015-07-31 NOTE — Telephone Encounter (Signed)
Gave and printed appt sched and avs for pt for Feb adn NOV

## 2015-10-27 ENCOUNTER — Telehealth: Payer: Self-pay | Admitting: Nurse Practitioner

## 2015-10-27 NOTE — Telephone Encounter (Signed)
Pt called to r/s apt for 02/07 due to another apt. Pt confirmed new D/T... kj

## 2015-10-31 ENCOUNTER — Encounter: Payer: Self-pay | Admitting: Nurse Practitioner

## 2015-11-02 ENCOUNTER — Ambulatory Visit (HOSPITAL_BASED_OUTPATIENT_CLINIC_OR_DEPARTMENT_OTHER): Payer: BLUE CROSS/BLUE SHIELD | Admitting: Nurse Practitioner

## 2015-11-02 ENCOUNTER — Encounter: Payer: Self-pay | Admitting: Nurse Practitioner

## 2015-11-02 VITALS — BP 117/97 | HR 86 | Temp 98.8°F | Resp 18 | Ht 61.0 in | Wt 148.4 lb

## 2015-11-02 DIAGNOSIS — Z853 Personal history of malignant neoplasm of breast: Secondary | ICD-10-CM

## 2015-11-02 DIAGNOSIS — N8189 Other female genital prolapse: Secondary | ICD-10-CM

## 2015-11-02 DIAGNOSIS — N959 Unspecified menopausal and perimenopausal disorder: Secondary | ICD-10-CM

## 2015-11-02 NOTE — Patient Instructions (Addendum)
Thank you for coming in today!  I will make the referral to [facility] in [facility] so that we can get you connected there.  Please call [facility] and let's see what is possible for working with them.  In the mean time, please start using the vaginal moisturizers and / or lubricants as we discussed.    Keep your follow up appointment with Dr. Burr Medico and let us know if you have any new symptom or change in your condition.  Below is a list of common things to monitor for from your breast cancer standpoint:  Living a Life of Wellness After Cancer:  *Note: Please consult your health care provider before using any medications, supplements, over-the-counter products, or other interventions.  Also, please consult your primary care provider before you begin any lifestyle program (diet, exercise, etc.).  Your safety is our top priority and we want to make sure you continue to live a long and healthy life!   Symptoms to Watch for and Report to Your Provider  . Change along your reconstructed breast . New or unusual pain that seems unrelated to an injury and does not go away, including back pain or bone pain . Weight loss without trying/intending . Unexplained bleeding . A rash or allergic reaction, such as swelling, severe itching or wheezing . Chills or fevers . Persistent headaches . Shortness of breath or difficulty breathing . Bloody stools or blood in your urine . Nausea, vomiting, diarrhea, loss of appetite, or trouble swallowing . A cough that does not go away . Abdominal pain . Swelling in your arms or legs . Fractures . Hot flashes or other menopausal symptoms . Any other signs mentioned by your doctor or nurse or any unusual symptoms                 that you just can't explain   NOTE: Just because you have certain symptoms, it doesn't mean the cancer has come back or you have a new cancer. Symptoms can be due to other problems that need to be addressed.  It is important to watch for  these symptoms and report them to your provider so you can be medically evaluated for any of these concerns!     Healthy Lifestyle Recommendations  As a cancer survivor, it is important develop a lifelong commitment to a healthy lifestyle. A healthy lifestyle can prevent cancer from returning as well as prevent other diseases like heart disease, diabetes and high blood pressure.  These are some things that you can do to have a healthy lifestyle:  Marland Kitchen Maintain a healthy weight.  . Exercise daily per your doctor's orders. . Eat a balanced diet high in fruits, vegetables, bran, and fiber. Limit intake of red meat      and processed foods.  . Limit how much alcohol you consume, if at all. Ali Lowe regular bone mineral density testing for osteoporosis.  . Talk to your doctor about cardiovascular disease or "heart disease" screening. . Stop smoking (if you smoke). . Know your family history. . Be mindful of your emotional, social, and spiritual needs. . Meet regularly with a Primary Care Provider (PCP). Find a PCP if you do not             already have one. . Talk to your doctor about regular cancer screening including screening for colon           cancer, GYN cancers, and skin cancer.

## 2015-11-02 NOTE — Progress Notes (Signed)
CLINIC:  Cancer Survivorship   REASON FOR VISIT:  Routine follow-up post-treatment for history of breast cancer.      INTERVAL HISTORY:  [name] presents to the Survivorship Clinic today for ongoing follow up regarding her history of breast cancer. Overall, [name] reports feeling doing fairly well since her last visit with Dr. Burr Medico. She denies any change along either reconstructed breast.  She has a good appetite and denies weight loss.  She denies any headache, cough, or shortness of breath.  She has back pain and states that it is about the same.  Her biggest complaint centers around decreased libido and vaginal dryness / pain. She has seen multiple counselors to no avail. She has attempted a trial of vaginal topical estrogen therapy x 6 months with no relief.  She has not used vaginal moisturizers nor dilators.  She is on sertraline and has tried other agents in the past for her depression.  She states that the symptoms were bad before the change to sertraline and is uncertain what effect it has had on them. She reports a good relationship with her husband and that he is very patient with her, however, this is very troubling to her.    REVIEW OF SYSTEMS:  General: Denies fever, chills, unintentional weight loss, or generalized fatigue.  HEENT: Wears glasses. Denies visual changes, hearing loss, mouth sores, or difficulty swallowing. Cardiac: Denies palpitations and lower extremity edema.  Respiratory: Denies wheeze or dyspnea on exertion.  Breast: Denies any new nodularity, masses, tenderness, nipple changes, or nipple discharge.  GI: Diarrhea secondary to IBS.  Otherwise, denies abdominal pain, constipation, nausea, or vomiting.  GU: Denies dysuria, hematuria, vaginal bleeding, vaginal discharge, or vaginal dryness.  Musculoskeletal: back pain secondary to arthritis, otherwise, denies joint or bone pain.  Neuro: Denies recent fall or numbness / tingling in her  extremities. Denies peripheral neuropathy. Skin: Denies rash, pruritis, or open wounds.  Psych: Depression as above.  Otherwise, denies  anxiety, insomnia, or memory loss.   A 14-point review of systems was completed and was negative, except as noted above.   ONCOLOGY TREATMENT TEAM:  1. Surgeon:  Dr. Excell Seltzer at [facility]  2. Medical Oncologist: Dr. Burr Medico 3. Plastic Surgeon: Dr. Harlow Mares    PAST MEDICAL/SURGICAL HISTORY:  Past Medical History  Diagnosis Date  . Endometriosis   . Cancer (HCC)     Breast cancer-Intraductal and DCIS  . Elevated cholesterol   . IBS (irritable bowel syndrome)   . Osteoporosis 02/2014    T score of -2.7 L3, L4   Past Surgical History  Procedure Laterality Date  . Varicose veins    . Herniated disc      Neck  . Vaginal hysterectomy    . Oophorectomy  2007    RSO and LSO  . Breast surgery      Bilateral Mastectomy and reconstruction  . Breast surgery      lumpectomy  . Tubal ligation       ALLERGIES:  Allergies  Allergen Reactions  . Codeine Itching  . Morphine And Related Itching     CURRENT MEDICATIONS:  Current Outpatient Prescriptions on File Prior to Visit  Medication Sig Dispense Refill  . calcium carbonate (OS-CAL) 600 MG TABS tablet Take 600 mg by mouth 2 (two) times daily with a meal.    . Cholecalciferol (VITAMIN D3) 1000 UNITS CAPS Take 1,000 Int'l Units by mouth 2 (two) times daily.    Marland Kitchen gabapentin (NEURONTIN) 300  MG capsule TAKE 2 CAPSULES BY MOUTH 3 TIMES A DAY 180 capsule 3  . sertraline (ZOLOFT) 100 MG tablet Take 1.5 tablets by mouth at bedtime 45 tablet 6  . alendronate (FOSAMAX) 70 MG tablet TAKE 1 TABLET BY MOUTH ONCE A WEEK WITH A FULL GLASS OF WATER ON AN EMPTY STOMAC (Patient not taking: Reported on 11/02/2015) 4 tablet 6   No current facility-administered medications on file prior to visit.     ONCOLOGIC FAMILY HISTORY:  Family History  Problem Relation Age of Onset  . Diabetes Son   . Atrial  fibrillation Mother   . Heart disease Father   . Cancer Sister     skin cancer - chemotherapy      SOCIAL HISTORY:  Beth Noble is married and lives with her spouse in [place], [place].  They will celebrate their 20th wedding anniversary in August 2017.  She has 2 children including a son from whom she is estranged. [name] is currently not working.  She is a former smoker and  denies any current or history of illicit drug use.  She uses alcohol approximately 8 drinks / week.   PHYSICAL EXAMINATION:  Vital Signs: Filed Vitals:   11/02/15 1445  BP: 117/97  Pulse: 86  Temp: 98.8 F (37.1 C)  Resp: 18   ECOG performance status: 0 General: Well-nourished, well-appearing female in no acute distress.  She is unaccompanied in clinic today.   HEENT: Head is atraumatic and normocephalic.  Pupils equal and reactive to light and accomodation. Conjunctivae clear without exudate.  Sclerae anicteric. Oral mucosa is pink, moist, and intact without lesions.  Oropharynx is pink without lesions or erythema.  Lymph: No cervical, supraclavicular, infraclavicular, or axillary lymphadenopathy noted on palpation.  Cardiovascular: Regular rate and rhythm without murmurs, rubs, or gallops. Respiratory: Clear to auscultation bilaterally. Chest expansion symmetric without accessory muscle use on inspiration or expiration.  Breast: Bilateral breast exam performed. Bilateral reconstruction with latissimus flap reconstruction on the right;  implant on the left.  No mass or lesion within either reconstructed breast. GI: Abdomen soft and round. No tenderness to palpation. Bowel sounds normoactive in 4 quadrants. No hepatosplenomegaly.   GU: Deferred.   Neuro: No focal deficits. Steady gait.  Psych: Mood and affect normal and appropriate for situation.  Extremities: No edema, cyanosis, or clubbing.  Skin: Warm and dry. No open lesions noted.   LABORATORY DATA:  No results found for  this or any previous visit (from the past 2160 hour(s)).       ASSESSMENT AND PLAN:   1. 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  [name] is doing well with no clinical symptoms worrisome for cancer recurrence at this time. I have reviewed the recommendations for ongoing surveillance with her and she will follow-up with her medical oncologist,  Dr. Burr Medico, in November 2017 with history and physical exam per surveillance protocol.  She knows to report any new symptom or change in her condition that arises prior to that time.  2. Post menopausal symptoms: I believe that [name]'s  symptoms of decreased libido, vaginal dryness, and dyspareunia are multifactorial, likely aggravated greatest by her post menopausal status.  This, combined with her depression, make it very challenging to treat.  She has used topical estrogen for six months with no results.  She has undergone counseling at least three separate times for up to a year at a time with no improvement.  She is on sertraline which is likely contributing to her libido issues.  I have recommended to her that we make referral for her to see a sexual therapist with the [facility] here in [place] as well as with [facility] for help with the post menopausal changes.  I believe she could likely benefit from a change in her antidepressant, however, as many agents also contribute to decreased libido, I don't want to make a change prior to input from the sex therapist.  [name] will reach out to [facility] early next week and I will enter referral for her to see Malachy Mood. She was offered support today through active listening and expressive  supportive counseling.  I have also encouraged her to begin regular use of a vaginal moisturizer such as Replens 1-2 times / week as well as vaginal lubricants when having sexual intercourse.  3. Cancer screening:  Due to [name]'s history and her age, she should receive screening for skin cancers, colon cancer, and gynecologic cancers.  The information and recommendations were shared with the patient and in her written after visit summary.     A total of 30 minutes of face-to-face time was spent with this patient with greater than 50% of that time in counseling and care-coordination.   Beth Cheese, NP  Survivorship Program [facility] [phone]   Note: PRIMARY CARE PROVIDER [name], [place] [phone]

## 2015-11-21 ENCOUNTER — Ambulatory Visit: Payer: BLUE CROSS/BLUE SHIELD | Admitting: Family Medicine

## 2015-12-05 ENCOUNTER — Ambulatory Visit
Admission: RE | Admit: 2015-12-05 | Discharge: 2015-12-05 | Disposition: A | Discharge status: Home / Self Care | Payer: BLUE CROSS/BLUE SHIELD | Source: Ambulatory Visit | Attending: Family Medicine | Admitting: Family Medicine

## 2015-12-05 ENCOUNTER — Other Ambulatory Visit: Payer: Self-pay | Admitting: *Deleted

## 2015-12-05 ENCOUNTER — Encounter: Payer: Self-pay | Admitting: Family Medicine

## 2015-12-05 ENCOUNTER — Ambulatory Visit (INDEPENDENT_AMBULATORY_CARE_PROVIDER_SITE_OTHER): Payer: BLUE CROSS/BLUE SHIELD | Admitting: Family Medicine

## 2015-12-05 VITALS — BP 130/72 | HR 68 | Temp 98.7°F | Resp 14 | Ht 61.0 in | Wt 148.0 lb

## 2015-12-05 DIAGNOSIS — S82892A Other fracture of left lower leg, initial encounter for closed fracture: Secondary | ICD-10-CM

## 2015-12-05 DIAGNOSIS — S99912A Unspecified injury of left ankle, initial encounter: Secondary | ICD-10-CM

## 2015-12-05 DIAGNOSIS — B351 Tinea unguium: Secondary | ICD-10-CM | POA: Diagnosis not present

## 2015-12-05 DIAGNOSIS — M810 Age-related osteoporosis without current pathological fracture: Secondary | ICD-10-CM

## 2015-12-05 LAB — COMPREHENSIVE METABOLIC PANEL
ALK PHOS: 51 U/L (ref 33–130)
ALT: 18 U/L (ref 6–29)
AST: 19 U/L (ref 10–35)
Albumin: 4.6 g/dL (ref 3.6–5.1)
BUN: 16 mg/dL (ref 7–25)
CHLORIDE: 102 mmol/L (ref 98–110)
CO2: 27 mmol/L (ref 20–31)
CREATININE: 0.56 mg/dL (ref 0.50–1.05)
Calcium: 10.5 mg/dL — ABNORMAL HIGH (ref 8.6–10.4)
Glucose, Bld: 113 mg/dL — ABNORMAL HIGH (ref 70–99)
Potassium: 3.9 mmol/L (ref 3.5–5.3)
SODIUM: 139 mmol/L (ref 135–146)
TOTAL PROTEIN: 7 g/dL (ref 6.1–8.1)
Total Bilirubin: 0.4 mg/dL (ref 0.2–1.2)

## 2015-12-05 MED ORDER — IBANDRONATE SODIUM 150 MG PO TABS: 150.0000 mg | ORAL_TABLET | ORAL | Status: DC

## 2015-12-05 MED ORDER — TERBINAFINE HCL 250 MG PO TABS: 250.0000 mg | ORAL_TABLET | Freq: Every day | ORAL | Status: DC

## 2015-12-05 MED ORDER — CILIDINIUM-CHLORDIAZEPOXIDE 2.5-5 MG PO CAPS: ORAL_CAPSULE | ORAL | Status: DC

## 2015-12-05 NOTE — Progress Notes (Signed)
Patient ID: Beth Noble, female   DOB: [birth year], 60 y.o.   MRN: [id number]   Subjective:    Patient ID: Beth Noble, female    DOB: Apr 16, 1956, 60 y.o.   MRN: [id number]  Patient presents for Ankle Pain Patient here with left ankle injury. About a month ago she was ice skating and she twisted her ankle she did have significant bruising about 48 hours later. The swelling started to go down but now there is no change with the swelling. She does have underlying osteoporosis and arthritis. She did use ice and she also took ibuprofen as needed. She is able to walk but when she bends or moves her ankle in certain direction she still gets pain.  She has history of nail fungus on the bilateral toenails. She tried using over-the-counter topicals but this has not helped. She does currently have a pedicure but both great toenails have recurrence of the fungus. She would like to try the pill to help with this.   On review of her medications she was placed on Fosamax by her GYN because of osteoporosis. She states that the medication was not refilled because she has not had a follow-up but she also stopped taking it because it was causing some dizziness and a little GI upset. She actually wants to have the infusion done but there was some issue with her insurance company. She is taking calcium and vitamin D and trying to do weightbearing activities. Her last bone density was in 2015   Review Of Systems:  GEN- denies fatigue, fever, weight loss,weakness, recent illness HEENT- denies eye drainage, change in vision, nasal discharge, CVS- denies chest pain, palpitations RESP- denies SOB, cough, wheeze ABD- denies N/V, change in stools, abd pain GU- denies dysuria, hematuria, dribbling, incontinence MSK- + joint pain, muscle aches, injury Neuro- denies headache, dizziness, syncope, seizure activity       Objective:    BP 130/72 mmHg  Pulse 68  Temp(Src) 98.7 F (37.1 C) (Oral)   Resp 14  Ht 5\' 1"  (1.549 m)  Wt 148 lb (67.132 kg)  BMI 27.98 kg/m2 GEN- NAD, alert and oriented x3 MSK- Right ankle, FROM, left Ankle, good ROM, pain with inversion, pain with extension of foot, swelling over lateral malleous, TTP, neg squeeze test, achilles in tact  sKIN- thickened nails, unable to completely visualize due to polish Pulses- Radial, DP- 2+        Assessment & Plan:      Problem List Items Addressed This Visit    Osteoporosis    Trial of once monthly Boniva, if she fails this then trial of either Prolia or IV       Relevant Medications   ibandronate (BONIVA) 150 MG tablet    Other Visit Diagnoses    Toenail fungus    -  Primary    As she had previous infection, will treat with oral terbinafine, liver function to be drawn today    Relevant Medications    terbinafine (LAMISIL) 250 MG tablet    Other Relevant Orders    Comprehensive metabolic panel    Ankle injury, left, initial encounter        Concern for definite sprain, but possible fracture, with her underlying osteoporosis    Relevant Orders    DG Ankle Complete Left       Note: This dictation was prepared with Dragon dictation along with smaller phrase technology. Any transcriptional errors that result from this process are unintentional.

## 2015-12-05 NOTE — Patient Instructions (Signed)
Do not start the terbinafine until we call with lab results We will try for another osteoporosis medication Get xray [address] F/U pending results

## 2015-12-05 NOTE — Assessment & Plan Note (Signed)
Trial of once monthly Boniva, if she fails this then trial of either Prolia or IV

## 2015-12-06 ENCOUNTER — Other Ambulatory Visit: Payer: Self-pay | Admitting: *Deleted

## 2015-12-06 DIAGNOSIS — Z79899 Other long term (current) drug therapy: Secondary | ICD-10-CM

## 2015-12-26 ENCOUNTER — Other Ambulatory Visit: Payer: Self-pay | Admitting: Family Medicine

## 2015-12-27 NOTE — Telephone Encounter (Signed)
Refill appropriate and filled per protocol. 

## 2016-01-22 ENCOUNTER — Other Ambulatory Visit: Payer: BLUE CROSS/BLUE SHIELD

## 2016-01-22 DIAGNOSIS — Z853 Personal history of malignant neoplasm of breast: Secondary | ICD-10-CM

## 2016-01-22 DIAGNOSIS — Z79899 Other long term (current) drug therapy: Secondary | ICD-10-CM

## 2016-01-22 LAB — COMPLETE METABOLIC PANEL WITH GFR
ALBUMIN: 4.5 g/dL (ref 3.6–5.1)
ALK PHOS: 50 U/L (ref 33–130)
ALT: 17 U/L (ref 6–29)
AST: 20 U/L (ref 10–35)
BILIRUBIN TOTAL: 0.3 mg/dL (ref 0.2–1.2)
BUN: 15 mg/dL (ref 7–25)
CALCIUM: 9.6 mg/dL (ref 8.6–10.4)
CO2: 30 mmol/L (ref 20–31)
CREATININE: 0.53 mg/dL (ref 0.50–1.05)
Chloride: 102 mmol/L (ref 98–110)
GFR, Est African American: >89 mL/min (ref >=60)
GFR, Est Non African American: >89 mL/min (ref >=60)
GLUCOSE: 103 mg/dL — AB (ref 70–99)
Potassium: 4.7 mmol/L (ref 3.5–5.3)
SODIUM: 140 mmol/L (ref 135–146)
TOTAL PROTEIN: 6.7 g/dL (ref 6.1–8.1)

## 2016-01-23 ENCOUNTER — Other Ambulatory Visit: Payer: Self-pay | Admitting: *Deleted

## 2016-01-23 ENCOUNTER — Other Ambulatory Visit: Payer: Self-pay | Admitting: Family Medicine

## 2016-01-23 NOTE — Telephone Encounter (Signed)
Refill appropriate and filled per protocol. 

## 2016-03-09 ENCOUNTER — Other Ambulatory Visit: Payer: Self-pay | Admitting: Family Medicine

## 2016-03-11 ENCOUNTER — Other Ambulatory Visit: Payer: Self-pay | Admitting: Family Medicine

## 2016-03-12 NOTE — Telephone Encounter (Signed)
Refill appropriate and filled per protocol. 

## 2016-03-12 NOTE — Telephone Encounter (Signed)
She has completed 12 weeks, none further

## 2016-03-12 NOTE — Telephone Encounter (Signed)
Ok to refill?  Last CMP on 01/22/2016 WNL.

## 2016-04-03 ENCOUNTER — Telehealth: Payer: Self-pay | Admitting: Family Medicine

## 2016-04-03 MED ORDER — IBANDRONATE SODIUM 150 MG PO TABS: ORAL_TABLET | ORAL | Status: DC

## 2016-04-03 NOTE — Telephone Encounter (Signed)
Call placed to patient.   Reports that she was taking lamisil, but has since stopped. Inquired if she should still be coming in for labs Q M.   Advised that we monitor liver function while on Lamisil, so since she is no longer taking it, she does not require monitoring.   Verbalized understanding.   Requested refill on Osteoporosis med (Boniva). Prescription sent to pharmacy.

## 2016-04-03 NOTE — Telephone Encounter (Signed)
PATIENT CALLING TO ASK THE QUESTION IF SHE SHOULD STILL BE COMING IN EVERY MONTH FOR BLOOD WORK  [phone]

## 2016-04-05 ENCOUNTER — Telehealth: Payer: Self-pay | Admitting: *Deleted

## 2016-04-05 NOTE — Telephone Encounter (Signed)
noted 

## 2016-04-05 NOTE — Telephone Encounter (Signed)
Received call from [facility] with [facility].   Reports that patient Beth Noble was mislabeled. Prescription was supposed to state TAKE 1 TABLET BY MOUTH EVERY 30 DAYS ON AN EMPTY STOMACH. However, prescription label was incorrect and states TAKE 1 TABLET BY MOUTH EVERY DAY ON AN EMPTY STOMACH.  Patient notified of mis-documentation and made aware to only take once per month.

## 2016-04-07 ENCOUNTER — Other Ambulatory Visit: Payer: Self-pay | Admitting: Family Medicine

## 2016-04-08 NOTE — Telephone Encounter (Signed)
Refill appropriate and filled per protocol. 

## 2016-04-29 ENCOUNTER — Other Ambulatory Visit: Payer: BLUE CROSS/BLUE SHIELD

## 2016-04-30 ENCOUNTER — Ambulatory Visit (INDEPENDENT_AMBULATORY_CARE_PROVIDER_SITE_OTHER): Payer: BLUE CROSS/BLUE SHIELD | Admitting: Family Medicine

## 2016-04-30 ENCOUNTER — Encounter: Payer: Self-pay | Admitting: Family Medicine

## 2016-04-30 VITALS — BP 138/70 | HR 82 | Temp 98.7°F | Resp 14 | Ht 61.0 in | Wt 143.0 lb

## 2016-04-30 DIAGNOSIS — A09 Infectious gastroenteritis and colitis, unspecified: Secondary | ICD-10-CM

## 2016-04-30 LAB — CBC WITH DIFFERENTIAL/PLATELET
BASOS PCT: 0 %
Basophils Absolute: 0 cells/uL (ref 0–200)
Eosinophils Absolute: 116 cells/uL (ref 15–500)
Eosinophils Relative: 2 %
HEMATOCRIT: 35.6 % (ref 35.0–45.0)
HEMOGLOBIN: 12.1 g/dL (ref 12.0–15.0)
LYMPHS PCT: 18 %
Lymphs Abs: 1044 cells/uL (ref 850–3900)
MCH: 33.6 pg — ABNORMAL HIGH (ref 27.0–33.0)
MCHC: 34 g/dL (ref 32.0–36.0)
MCV: 98.9 fL (ref 80.0–100.0)
MONOS PCT: 5 %
MPV: 9.6 fL (ref 7.5–12.5)
Monocytes Absolute: 290 cells/uL (ref 200–950)
NEUTROS PCT: 75 %
Neutro Abs: 4350 cells/uL (ref 1500–7800)
Platelets: 331 10*3/uL (ref 140–400)
RBC: 3.6 MIL/uL — AB (ref 3.80–5.10)
RDW: 12.6 % (ref 11.0–15.0)
WBC: 5.8 10*3/uL (ref 3.8–10.8)

## 2016-04-30 LAB — COMPREHENSIVE METABOLIC PANEL
ALBUMIN: 4.4 g/dL (ref 3.6–5.1)
ALK PHOS: 60 U/L (ref 33–130)
ALT: 40 U/L — AB (ref 6–29)
AST: 23 U/L (ref 10–35)
BILIRUBIN TOTAL: 0.3 mg/dL (ref 0.2–1.2)
BUN: 7 mg/dL (ref 7–25)
CALCIUM: 9.1 mg/dL (ref 8.6–10.4)
CO2: 25 mmol/L (ref 20–31)
Chloride: 101 mmol/L (ref 98–110)
Creat: 0.6 mg/dL (ref 0.50–1.05)
Glucose, Bld: 94 mg/dL (ref 70–99)
Potassium: 3.6 mmol/L (ref 3.5–5.3)
SODIUM: 140 mmol/L (ref 135–146)
TOTAL PROTEIN: 6.7 g/dL (ref 6.1–8.1)

## 2016-04-30 MED ORDER — CIPROFLOXACIN HCL 500 MG PO TABS: 500.0000 mg | ORAL_TABLET | Freq: Two times a day (BID) | ORAL | 0 refills | Status: DC

## 2016-04-30 NOTE — Progress Notes (Signed)
   Subjective:    Patient ID: Beth Noble, female    DOB: [birth year], 60 y.o.   MRN: [id number]  Patient presents for Hospital F/U (went to [place]- was seen in ER for GI upset- given ABTx )  Here for hospital follow-up. She was at a resort in [place] when she became acutely ill side with nausea vomiting diarrhea. She became dehydrated. She was sent to a local hospital in [facility], diagnosed with a parasitic infection she also had blood in the stool. Rotavirus was negative, Entomeoba Coli positive-that is supposed to be a harmless bacteria. She was prescribed Flagyl along with an antidiarrhea and probiotic. She has taken 8 days of Flagyl she called her gastroenterologist when she return thereby certificate continue the Flagyl. She did not have any CT scan she did not have the blood work. She is still having diarrhea she had it 3 times this morning but is starting to improve some. She still has pain in the left lower quadrant .She still has nausea but has not had any emesis. She is trying to drink. She has not had any fever. Her husband was not sick at the resort No recent blood in the stool Also had some cough and congestion treated with robitussin DM    Review Of Systems:  GEN- denies fatigue, fever, weight loss,weakness, recent illness HEENT- denies eye drainage, change in vision, nasal discharge, CVS- denies chest pain, palpitations RESP- denies SOB, +cough, wheeze ABD- denies N/V, +change in stools, +abd pain GU- denies dysuria, hematuria, dribbling, incontinence MSK- denies joint pain, muscle aches, injury Neuro- denies headache, dizziness, syncope, seizure activity       Objective:    BP 138/70 (BP Location: Right Arm, Patient Position: Sitting, Cuff Size: Normal)   Pulse 82   Temp 98.7 F (37.1 C) (Oral)   Resp 14   Ht 5\' 1"  (1.549 m)   Wt 143 lb (64.9 kg)   BMI 27.02 kg/m  GEN- NAD, alert and oriented x3,weight down 5lbs since March HEENT- PERRL, EOMI, non  injected sclera, pink conjunctiva, MMM, oropharynx clear Neck- Supple, no LAD  CVS- RRR, no murmur RESP-CTAB ABD-NABS,soft, TTP LLQ, no rebound, no guarding EXT- No edema Pulses- Radial  2+        Assessment & Plan:      Problem List Items Addressed This Visit    None    Visit Diagnoses    Diarrhea of infectious origin    -  Primary   Concern for some type of infectious gastroenteritis she was told she had parasite, ? more bacterial food poisoning, I dont see parasite specific on her instructions As she is still having diarrhea and pain clinic had Cipro to her Flagyl. I will also obtain CBC and metabolic panel. Advised her to restart probiotics as well.    Relevant Medications   metroNIDAZOLE (FLAGYL) 500 MG tablet   Other Relevant Orders   CBC with Differential/Platelet   Comprehensive metabolic panel      Note: This dictation was prepared with Dragon dictation along with smaller phrase technology. Any transcriptional errors that result from this process are unintentional.

## 2016-04-30 NOTE — Patient Instructions (Signed)
Probiotics- Align or Mathis Bud, [place] yellow label  Take Cipro for 1 week Take Flagyl for another 1 week  We will call with lab results

## 2016-05-10 ENCOUNTER — Encounter: Payer: Self-pay | Admitting: Family Medicine

## 2016-05-10 ENCOUNTER — Ambulatory Visit (INDEPENDENT_AMBULATORY_CARE_PROVIDER_SITE_OTHER): Payer: BLUE CROSS/BLUE SHIELD | Admitting: Family Medicine

## 2016-05-10 VITALS — BP 130/72 | HR 76 | Temp 98.7°F | Resp 12 | Ht 61.0 in | Wt 140.0 lb

## 2016-05-10 DIAGNOSIS — R197 Diarrhea, unspecified: Secondary | ICD-10-CM

## 2016-05-10 DIAGNOSIS — M810 Age-related osteoporosis without current pathological fracture: Secondary | ICD-10-CM

## 2016-05-10 DIAGNOSIS — A09 Infectious gastroenteritis and colitis, unspecified: Secondary | ICD-10-CM

## 2016-05-10 DIAGNOSIS — R1030 Lower abdominal pain, unspecified: Secondary | ICD-10-CM | POA: Diagnosis not present

## 2016-05-10 MED ORDER — RISEDRONATE SODIUM 150 MG PO TABS: 150.0000 mg | ORAL_TABLET | ORAL | 11 refills | Status: DC

## 2016-05-10 NOTE — Assessment & Plan Note (Signed)
Unable to afford boniva Did not tolerate fosamax See if actonel is cheaper, otherwise we can try for infusion

## 2016-05-10 NOTE — Progress Notes (Signed)
   Subjective:    Patient ID: Beth Noble, female    DOB: 1956-04-04, 60 y.o.   MRN: [id number]  Patient presents for Follow-up (parasitic diarrhea from [place]- completed 17 days of ABTx) Agent here for interim follow-up. She was seen about 10 days ago after returning from a trip from [place] with an infectious diarrhea. She was treated with Cipro and Flagyl as well as probiotics. She completed her antibiotics but continues to have soft bowel movements. She also has crampy abdominal pain that will hit her very severe. She's not had any fever no blood in the stool. Stools are actually little more formed than they were before. Her appetite is improving but she is very careful with what she she's concerned it will make her abdomen cramping her weight is down another 3 pounds. She's had some fatigue with her symptoms as well.  Should also like a cheaper alternative to her Boniva  Review Of Systems:  GEN-+ fatigue, fever, weight loss,weakness, recent illness HEENT- denies eye drainage, change in vision, nasal discharge, CVS- denies chest pain, palpitations RESP- denies SOB, cough, wheeze ABD- denies N/V, +change in stools, +abd pain GU- denies dysuria, hematuria, dribbling, incontinence MSK- denies joint pain, muscle aches, injury Neuro- +headache, denies  dizziness, syncope, seizure activity       Objective:    BP 130/72 (BP Location: Left Arm, Patient Position: Sitting, Cuff Size: Normal)   Pulse 76   Temp 98.7 F (37.1 C) (Oral)   Resp 12   Ht 5\' 1"  (1.549 m)   Wt 140 lb (63.5 kg)   BMI 26.45 kg/m  GEN- NAD, alert and oriented x3,weight down  HEENT- PERRL, EOMI, non injected sclera, pink conjunctiva, MMM, oropharynx clear,fundus benign appearing Neck- Supple, no LAD CVS- RRR, no murmur RESP-CTAB ABD-hypoactive BS in all 4 quadrants soft,TTP lower quadrants no rebound, no guarding, EXT- No edema Pulses- Radial, DP- 2+        Assessment & Plan:      Problem List  Items Addressed This Visit    None    Visit Diagnoses    Lower abdominal pain    -  Primary   persisatant epsiodes of pain, though diarrhea has improved some with antibiotics and probiotics, will obtain CT abd/pelvis, her recent labs were fairly unremarkable, she is non toxic appearing    Diarrhea of presumed infectious origin          Note: This dictation was prepared with Dragon dictation along with smaller phrase technology. Any transcriptional errors that result from this process are unintentional.

## 2016-05-10 NOTE — Patient Instructions (Addendum)
CT scan of abdomen and pelvis  Take contrast as prescribed No change to medications We will check on the Boniva  F/U pending results

## 2016-05-15 ENCOUNTER — Telehealth: Payer: Self-pay | Admitting: *Deleted

## 2016-05-15 NOTE — Telephone Encounter (Signed)
Call placed to patient and patient made aware.  

## 2016-05-15 NOTE — Telephone Encounter (Signed)
-----   Message from Alycia Rossetti, MD sent at 05/13/2016  4:03 PM EDT ----- Regarding: RE: Actonel Okay let pt know then, if you havent already ----- Message ----- From: Eden Lathe Six, LPN Sent: [id number]   3:49 PM To: Alycia Rossetti, MD Subject: RE: Actonel                                    Prescription was approved through insurance.   Co-pay is $10. ----- Message ----- From: Alycia Rossetti, MD Sent: 05/10/2016   1:52 PM To: Eden Lathe Six, LPN Subject: Actonel                                         Pt states Boniva too expensive, does not tolerate fosmax I sent Actonel, see if this is cheaper or whats on formulary

## 2016-05-17 ENCOUNTER — Ambulatory Visit
Admission: RE | Admit: 2016-05-17 | Discharge: 2016-05-17 | Disposition: A | Discharge status: Home / Self Care | Payer: BLUE CROSS/BLUE SHIELD | Source: Ambulatory Visit | Attending: Family Medicine | Admitting: Family Medicine

## 2016-05-17 DIAGNOSIS — R197 Diarrhea, unspecified: Secondary | ICD-10-CM

## 2016-05-17 DIAGNOSIS — R1030 Lower abdominal pain, unspecified: Secondary | ICD-10-CM

## 2016-05-17 MED ORDER — IOPAMIDOL (ISOVUE-300) INJECTION 61%
100.0000 mL | Freq: Once | INTRAVENOUS | Status: AC | PRN
  Administered 2016-05-17: 100 mL via INTRAVENOUS

## 2016-05-23 ENCOUNTER — Other Ambulatory Visit: Payer: Self-pay | Admitting: *Deleted

## 2016-05-23 DIAGNOSIS — R197 Diarrhea, unspecified: Secondary | ICD-10-CM

## 2016-05-23 DIAGNOSIS — R1030 Lower abdominal pain, unspecified: Secondary | ICD-10-CM

## 2016-05-28 ENCOUNTER — Ambulatory Visit (INDEPENDENT_AMBULATORY_CARE_PROVIDER_SITE_OTHER): Payer: BLUE CROSS/BLUE SHIELD | Admitting: Family Medicine

## 2016-05-28 ENCOUNTER — Encounter: Payer: Self-pay | Admitting: Family Medicine

## 2016-05-28 VITALS — BP 132/68 | HR 74 | Temp 98.9°F | Resp 14 | Ht 61.0 in | Wt 139.0 lb

## 2016-05-28 DIAGNOSIS — Z23 Encounter for immunization: Secondary | ICD-10-CM | POA: Diagnosis not present

## 2016-05-28 DIAGNOSIS — R1030 Lower abdominal pain, unspecified: Secondary | ICD-10-CM

## 2016-05-28 DIAGNOSIS — A09 Infectious gastroenteritis and colitis, unspecified: Secondary | ICD-10-CM | POA: Diagnosis not present

## 2016-05-28 DIAGNOSIS — Z1159 Encounter for screening for other viral diseases: Secondary | ICD-10-CM

## 2016-05-28 DIAGNOSIS — R197 Diarrhea, unspecified: Secondary | ICD-10-CM

## 2016-05-28 LAB — CBC WITH DIFFERENTIAL/PLATELET
BASOS ABS: 0 {cells}/uL (ref 0–200)
Basophils Relative: 0 %
EOS ABS: 260 {cells}/uL (ref 15–500)
Eosinophils Relative: 5 %
HCT: 37.8 % (ref 35.0–45.0)
Hemoglobin: 12.5 g/dL (ref 12.0–15.0)
LYMPHS PCT: 23 %
Lymphs Abs: 1196 cells/uL (ref 850–3900)
MCH: 32.8 pg (ref 27.0–33.0)
MCHC: 33.1 g/dL (ref 32.0–36.0)
MCV: 99.2 fL (ref 80.0–100.0)
MONOS PCT: 6 %
MPV: 11 fL (ref 7.5–12.5)
Monocytes Absolute: 312 cells/uL (ref 200–950)
Neutro Abs: 3432 cells/uL (ref 1500–7800)
Neutrophils Relative %: 66 %
PLATELETS: 208 10*3/uL (ref 140–400)
RBC: 3.81 MIL/uL (ref 3.80–5.10)
RDW: 12.7 % (ref 11.0–15.0)
WBC: 5.2 10*3/uL (ref 3.8–10.8)

## 2016-05-28 LAB — COMPREHENSIVE METABOLIC PANEL
ALT: 30 U/L — ABNORMAL HIGH (ref 6–29)
AST: 24 U/L (ref 10–35)
Albumin: 4.6 g/dL (ref 3.6–5.1)
Alkaline Phosphatase: 46 U/L (ref 33–130)
BILIRUBIN TOTAL: 0.4 mg/dL (ref 0.2–1.2)
BUN: 13 mg/dL (ref 7–25)
CO2: 28 mmol/L (ref 20–31)
CREATININE: 0.63 mg/dL (ref 0.50–1.05)
Calcium: 9.5 mg/dL (ref 8.6–10.4)
Chloride: 99 mmol/L (ref 98–110)
GLUCOSE: 89 mg/dL (ref 70–99)
Potassium: 3.9 mmol/L (ref 3.5–5.3)
SODIUM: 141 mmol/L (ref 135–146)
Total Protein: 6.9 g/dL (ref 6.1–8.1)

## 2016-05-28 MED ORDER — ELUXADOLINE 75 MG PO TABS: 1.0000 | ORAL_TABLET | Freq: Two times a day (BID) | ORAL | 0 refills | Status: DC

## 2016-05-28 NOTE — Patient Instructions (Addendum)
Call GI for a follow up appointment Try the Viberzi if your cultures come back negative  Flu shot given F/U Physical in Dec

## 2016-05-28 NOTE — Progress Notes (Signed)
   Subjective:    Patient ID: Beth Noble, female    DOB: Dec 17, 1955, 60 y.o.   MRN: [id number]  Patient presents for GI Pain (continues to have GI pain- stool cultures. O&P ordered)  Patient here for follow-up on abdominal pain which has been present since  End of July when she had some type of parasitic infectious Colitis  as while she was in [place]. She completed a course of Cipro and Flagyl for 14 days. She did have stool studies done which showed intermediate coli positive while she was in [place]. Her labs done in our office only showed mild elevation ALT to 40 otherwise unremarkable should've CT scan done which was unremarkable. She's completed 2 courses of probiotics She has underlying-year-old bowel syndrome which she thinks she may have worsened this weekend by eating some fried foods for past 3 days, has had 4-5 loose stools a day. Previous was down to 1-2 a day.  In general the past 2 weeks her abdominal pain and diarrhea had improved until this weekend. She did return stool studies to be done again today.   Review Of Systems:  GEN- denies fatigue, fever, weight loss,weakness, recent illness HEENT- denies eye drainage, change in vision, nasal discharge, CVS- denies chest pain, palpitations RESP- denies SOB, cough, wheeze ABD- denies N/V, +change in stools, +abd pain GU- denies dysuria, hematuria, dribbling, incontinence MSK- denies joint pain, muscle aches, injury Neuro- denies headache, dizziness, syncope, seizure activity       Objective:    BP 132/68 (BP Location: Left Arm, Patient Position: Sitting, Cuff Size: Normal)   Pulse 74   Temp 98.9 F (37.2 C) (Oral)   Resp 14   Ht 5\' 1"  (1.549 m)   Wt 139 lb (63 kg)   BMI 26.26 kg/m  GEN- NAD, alert and oriented x3 HEENT- PERRL, EOMI, non injected sclera, pink conjunctiva, MMM, oropharynx clear CVS- RRR, no murmur RESP-CTAB ABD-NABS,soft,NT,ND EXT- No edema Pulses- Radial - 2+        Assessment & Plan:       Problem List Items Addressed This Visit    None    Visit Diagnoses    Need for hepatitis C screening test    -  Primary   request Hep C screen   Relevant Orders   Hepatitis C Antibody   Lower abdominal pain       Relevant Orders   Comprehensive metabolic panel   Diarrhea of presumed infectious origin       I think her bowels with her underlying IBS is the reason she is taking such time to recover. CT neg, low liklihoanything will be in Stool studies at this point. If her studies are neg I recommend trying Viberzi to see if this is more of her untreated IBS worsened by recent infectious insult on bowels Also recommend an appt with her GI   Relevant Orders   Clostridium Difficile by PCR   Comprehensive metabolic panel   CBC with Differential/Platelet   Need for prophylactic vaccination and inoculation against influenza       Relevant Orders   Flu Vaccine QUAD 36+ mos PF IM (Fluarix & Fluzone Quad PF) (Completed)      Note: This dictation was prepared with Dragon dictation along with smaller phrase technology. Any transcriptional errors that result from this process are unintentional.

## 2016-05-29 LAB — HEPATITIS C ANTIBODY: HCV Ab: NEGATIVE

## 2016-05-29 LAB — CLOSTRIDIUM DIFFICILE BY PCR: CDIFFPCR: NOT DETECTED

## 2016-05-30 LAB — OVA AND PARASITE EXAMINATION: OP: NONE SEEN

## 2016-06-01 LAB — STOOL CULTURE

## 2016-06-04 ENCOUNTER — Telehealth: Payer: Self-pay

## 2016-06-04 NOTE — Telephone Encounter (Signed)
Pt called wanting lab results/ Lab results provided

## 2016-07-03 ENCOUNTER — Other Ambulatory Visit: Payer: Self-pay | Admitting: Family Medicine

## 2016-07-03 NOTE — Telephone Encounter (Signed)
Okay to refill? 

## 2016-07-03 NOTE — Telephone Encounter (Signed)
Ok to refill 

## 2016-07-04 NOTE — Telephone Encounter (Signed)
Medication called to pharmacy. 

## 2016-07-28 NOTE — Progress Notes (Signed)
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 She underwent a lumpectomy with partial breast radiation at [facility].  #2 patient subsequently developed a recurrence in November 2009 again DCIS in the lateral breast. She opted for bilateral mastectomies in January 2010. The final pathology did show DCIS that was ER negative PR negative. Patient also had a prophylactic left mastectomy with immediate reconstruction.  INTERVAL HISTORY: Mrs Beth Noble is a 8 female with past medical history of recurrent DCIS, status post bilateral mastectomy, is here for follow-up. She was last seen by me one year ago. She had a trip to [place] during the summer and unfortunately got a slight infection, had chronic diarrhea for a few months. Her symptoms and finally resolved, she otherwise is doing very well, she still has mild intermittent soreness at the bilateral mastectomy and reconstruction site, which is stable, tolerable. She denies any other new pain or other symptoms.  REVIEW OF SYSTEMS:   Constitutional: Denies fevers, chills or abnormal weight loss Eyes: Denies blurriness of vision Ears, nose, mouth, throat, and face: Denies mucositis or sore throat Respiratory: Denies cough, dyspnea or wheezes Cardiovascular: Denies palpitation, chest discomfort or lower extremity swelling Gastrointestinal:  Denies nausea, heartburn or change in bowel habits Skin: Denies abnormal skin rashes Lymphatics: Denies new lymphadenopathy or easy bruising Neurological:Denies numbness, tingling or new weaknesses Behavioral/Psych: Mood is stable, no new changes  All other systems were reviewed with the patient and are  negative.  MEDICAL HISTORY:  Past Medical History:  Diagnosis Date  . Cancer (HCC)    Breast cancer-Intraductal and DCIS  . Elevated cholesterol   . Endometriosis   . IBS (irritable bowel syndrome)   . Osteoporosis 02/2014   T score of -2.7 L3, L4    SURGICAL HISTORY: Past Surgical History:  Procedure Laterality Date  . BREAST SURGERY     Bilateral Mastectomy and reconstruction  . BREAST SURGERY     lumpectomy  . Herniated disc     Neck  . OOPHORECTOMY  2007   RSO and LSO  . TUBAL LIGATION    . VAGINAL HYSTERECTOMY    . Varicose veins      I have reviewed the social history and family history with the patient and they are unchanged from previous note.  ALLERGIES:  is allergic to codeine and morphine and related.  MEDICATIONS:  Current Outpatient Prescriptions  Medication Sig Dispense Refill  . calcium carbonate (OS-CAL) 600 MG TABS tablet Take 600 mg by mouth 2 (two) times daily with a meal.    . Cholecalciferol (VITAMIN D3) 1000 UNITS CAPS Take 1,000 Int'l Units by mouth 2 (two) times daily.    Marland Kitchen gabapentin (NEURONTIN) 300 MG capsule TAKE 2 CAPSULES BY MOUTH 3 TIMES A DAY 180 capsule 3  . risedronate (ACTONEL) 150 MG tablet Take 1 tablet (150 mg total) by mouth every 30 (thirty) days. with water on empty stomach, nothing by mouth or lie down for next 30 minutes. 30 tablet 11  . sertraline (ZOLOFT) 100 MG tablet TAKE 1.5 TABLETS BY MOUTH AT BEDTIME 45 tablet 6  . VIBERZI 75 MG TABS TAKE 1 TABLET BY MOUTH TWICE A DAY 60 tablet 0   No current facility-administered medications for  this visit.     PHYSICAL EXAMINATION: ECOG PERFORMANCE STATUS: 0 - Asymptomatic  Vitals:   07/29/16 1322  BP: 122/72  Pulse: 85  Resp: 18  Temp: 98.7 F (37.1 C)   Filed Weights   07/29/16 1322  Weight: 146 lb 6.4 oz (66.4 kg)    GENERAL:alert, no distress and comfortable SKIN: skin color, texture, turgor are normal, no rashes or significant lesions EYES: normal, Conjunctiva are  pink and non-injected, sclera clear OROPHARYNX:no exudate, no erythema and lips, buccal mucosa, and tongue normal  NECK: supple, thyroid normal size, non-tender, without nodularity LYMPH:  no palpable lymphadenopathy in the cervical, axillary or inguinal LUNGS: clear to auscultation and percussion with normal breathing effort HEART: regular rate & rhythm and no murmurs and no lower extremity edema ABDOMEN:abdomen soft, non-tender and normal bowel sounds Musculoskeletal:no cyanosis of digits and no clubbing  NEURO: alert & oriented x 3 with fluent speech, no focal motor/sensory deficits  LABORATORY DATA:  I have reviewed the data as listed CBC Latest Ref Rng & Units 05/28/2016 04/30/2016 07/31/2015  WBC 3.8 - 10.8 K/uL 5.2 5.8 4.6  Hemoglobin 12.0 - 15.0 g/dL 12.5 12.1 12.6  Hematocrit 35.0 - 45.0 % 37.8 35.6 37.8  Platelets 140 - 400 K/uL 208 331 188     CMP Latest Ref Rng & Units 05/28/2016 04/30/2016 01/22/2016  Glucose 70 - 99 mg/dL 89 94 103(H)  BUN 7 - 25 mg/dL 13 7 15   Creatinine 0.50 - 1.05 mg/dL 0.63 0.60 0.53  Sodium 135 - 146 mmol/L 141 140 140  Potassium 3.5 - 5.3 mmol/L 3.9 3.6 4.7  Chloride 98 - 110 mmol/L 99 101 102  CO2 20 - 31 mmol/L 28 25 30   Calcium 8.6 - 10.4 mg/dL 9.5 9.1 9.6  Total Protein 6.1 - 8.1 g/dL 6.9 6.7 6.7  Total Bilirubin 0.2 - 1.2 mg/dL 0.4 0.3 0.3  Alkaline Phos 33 - 130 U/L 46 60 50  AST 10 - 35 U/L 24 23 20   ALT 6 - 29 U/L 30(H) 40(H) 17      RADIOGRAPHIC STUDIES: I have personally reviewed the radiological images as listed and agreed with the findings in the report. No results found.   ASSESSMENT & PLAN:  60 year old Caucasian female, with past medical history of recurrent right breast DCIS, status post mastectomy and left prophylactic mastectomy with reconstructions. She is here for routine follow-up.  1. Right recurrent DCIS, ER-/PR- -her DCIS was cured by computed surgical resections. -She had bilateral mastectomy, so the risk of breast  cancer is minimum (but not zero).  -She is status post bilateral mastectomy, no need for mammogram. -I encouraged her to have healthy diet and exercise regularly -Her implant is about 59 years old, she plans to have her replaced implant next year by Dr. Marica Otter.  -She is clinically doing well, exam today is unremarkable. Her labs CBC and CMP from September 2017 was unremarkable. No clinical suspicion of recurrent cancer. -Due to her minimal risk of breast cancer, she can be followed up by her primary care physician. I'll see her as needed in the future.  All questions were answered. The patient knows to call the clinic with any problems, questions or concerns. No barriers to learning was detected.  I spent 15 minutes counseling the patient face to face. The total time spent in the appointment was 20 minutes and more than 50% was on counseling and review of test results     Truitt Merle, MD 07/29/16 1:38  PM

## 2016-07-29 ENCOUNTER — Ambulatory Visit (HOSPITAL_BASED_OUTPATIENT_CLINIC_OR_DEPARTMENT_OTHER): Payer: BLUE CROSS/BLUE SHIELD | Admitting: Hematology

## 2016-07-29 ENCOUNTER — Encounter: Payer: Self-pay | Admitting: Hematology

## 2016-07-29 ENCOUNTER — Telehealth: Payer: Self-pay | Admitting: Hematology

## 2016-07-29 ENCOUNTER — Other Ambulatory Visit: Payer: Self-pay

## 2016-07-29 VITALS — BP 122/72 | HR 85 | Temp 98.7°F | Resp 18 | Ht 61.0 in | Wt 146.4 lb

## 2016-07-29 DIAGNOSIS — Z853 Personal history of malignant neoplasm of breast: Secondary | ICD-10-CM

## 2016-07-29 DIAGNOSIS — Z86000 Personal history of in-situ neoplasm of breast: Secondary | ICD-10-CM | POA: Diagnosis not present

## 2016-07-29 NOTE — Telephone Encounter (Signed)
lvm to inform pt of cxl lab appt for 11/6 per LOS

## 2016-07-30 ENCOUNTER — Encounter: Payer: Self-pay | Admitting: Family Medicine

## 2016-07-30 ENCOUNTER — Ambulatory Visit (INDEPENDENT_AMBULATORY_CARE_PROVIDER_SITE_OTHER): Payer: BLUE CROSS/BLUE SHIELD | Admitting: Family Medicine

## 2016-07-30 VITALS — BP 124/78 | HR 89 | Temp 98.2°F | Resp 16 | Ht 61.0 in | Wt 147.0 lb

## 2016-07-30 DIAGNOSIS — M79641 Pain in right hand: Secondary | ICD-10-CM | POA: Diagnosis not present

## 2016-07-30 DIAGNOSIS — H1033 Unspecified acute conjunctivitis, bilateral: Secondary | ICD-10-CM | POA: Diagnosis not present

## 2016-07-30 MED ORDER — POLYMYXIN B-TRIMETHOPRIM 10000-0.1 UNIT/ML-% OP SOLN: 1.0000 [drp] | OPHTHALMIC | 0 refills | Status: DC

## 2016-07-30 MED ORDER — ELUXADOLINE 75 MG PO TABS: 1.0000 | ORAL_TABLET | Freq: Two times a day (BID) | ORAL | 3 refills | Status: DC

## 2016-07-30 NOTE — Patient Instructions (Signed)
[facility] [address] [place] [address] Use eye drops We will call with lab results  F/U as needed

## 2016-07-30 NOTE — Progress Notes (Signed)
   Subjective:    Patient ID: [facility], female    DOB: Nov 12, 1955, 60 y.o.   MRN: [id number]  Patient presents for both left and right eye foggy Patient here with bilateral eye irritation and drainage she is getting yellow green crusting in the morning and throughout the day. Her eyes feel like they're gritty. Initially started in her left eye then transferred to her right went over the past couple weeks. She had been using some Visine  She is also concerned about some arthritis in her hands she has a family history of rheumatoid arthritis. She noticed pain and swelling at her knuckles especially in the mornings. She would like to have this evaluated.   Review Of Systems:  GEN- denies fatigue, fever, weight loss,weakness, recent illness HEENT- + eye drainage, denies change in vision, nasal discharge, CVS- denies chest pain, palpitations RESP- denies SOB, cough, wheeze ABD- denies N/V, change in stools, abd pain Neuro- denies headache, dizziness, syncope, seizure activity MSK- +joint pain       Objective:    BP 124/78   Pulse 89   Temp 98.2 F (36.8 C) (Oral)   Resp 16   Ht 5\' 1"  (1.549 m)   Wt 147 lb (66.7 kg)   SpO2 97%   BMI 27.78 kg/m  GEN- NAD, alert and oriented x3 HEENT- PERRL, EOMI, mild injected  bilat sclera, + injected conjunctiva, yellow discharge right eye,dried pus in lashes  MMM, oropharynx clear Neck- Supple, no LAD  MSK- hand normal apperaence except mild swelling at MIP on right hand 2nd 3rd digits, able to make fist, no nodules , normal grasp      Assessment & Plan:      Problem List Items Addressed This Visit    None    Visit Diagnoses    Right hand pain    -  Primary   RA with family history vs OA, obtain xray, RF, CRP to be done   Relevant Orders   DG Hand Complete Right   Rheumatoid factor (Completed)   C-reactive protein (Completed)   Acute bacterial conjunctivitis of both eyes       Treat with polytrim      Note: This  dictation was prepared with Dragon dictation along with smaller phrase technology. Any transcriptional errors that result from this process are unintentional.

## 2016-07-31 ENCOUNTER — Other Ambulatory Visit: Payer: Self-pay | Admitting: Family Medicine

## 2016-07-31 LAB — RHEUMATOID FACTOR: Rhuematoid fact SerPl-aCnc: <14 IU/mL (ref <14)

## 2016-07-31 LAB — C-REACTIVE PROTEIN: CRP: 1.3 mg/L (ref <8.0)

## 2016-08-05 ENCOUNTER — Telehealth: Payer: Self-pay | Admitting: Family Medicine

## 2016-08-05 NOTE — Telephone Encounter (Signed)
Call placed to patient.   Reports that she thinks she has had reaction to eye drops. Patient has appointment with eye dr on 08/06/2016. States that she stopped using drops.

## 2016-08-05 NOTE — Telephone Encounter (Signed)
Patient calling to speak to you regarding the prescription for her eye that was given last week please call her at [phone]

## 2016-08-06 ENCOUNTER — Ambulatory Visit
Admission: RE | Admit: 2016-08-06 | Discharge: 2016-08-06 | Disposition: A | Discharge status: Home / Self Care | Payer: BLUE CROSS/BLUE SHIELD | Source: Ambulatory Visit | Attending: Family Medicine | Admitting: Family Medicine

## 2016-08-06 DIAGNOSIS — M79641 Pain in right hand: Secondary | ICD-10-CM

## 2016-12-17 ENCOUNTER — Ambulatory Visit (INDEPENDENT_AMBULATORY_CARE_PROVIDER_SITE_OTHER): Payer: BLUE CROSS/BLUE SHIELD | Admitting: Family Medicine

## 2016-12-17 ENCOUNTER — Encounter: Payer: Self-pay | Admitting: Family Medicine

## 2016-12-17 DIAGNOSIS — K580 Irritable bowel syndrome with diarrhea: Secondary | ICD-10-CM

## 2016-12-17 DIAGNOSIS — K589 Irritable bowel syndrome without diarrhea: Secondary | ICD-10-CM | POA: Insufficient documentation

## 2016-12-17 DIAGNOSIS — Z79899 Other long term (current) drug therapy: Secondary | ICD-10-CM

## 2016-12-17 NOTE — Patient Instructions (Signed)
Schedule appointment with your GI  F/U as needed

## 2016-12-17 NOTE — Assessment & Plan Note (Signed)
IBS long standing she has tried multiple medications. Now with side effects to Viberzi which did stop the diarrhea. She is currently in Librax given by her GI in past, recommend she schedule with them, I dont see anything else to add that she hasnt tried. She also had recent  URI, that is resolved, she still feels some weakness Check CBC/CMET in setting of worsening diarrhea as well. Note she did NOT take any antibiotics

## 2016-12-17 NOTE — Progress Notes (Signed)
   Subjective:    Patient ID: [facility], female    DOB: 03/27/1956, 61 y.o.   MRN: [id number]  Patient presents for GI Issue (has been taking Viberzi- concerned that she may be having SE- has stopped taking- now has cramping, diarrhea, and HA)  Patient here with cramping diarrhea headaches states that she stopped the Viberzi concerned she was having side effects that she still has similar symptoms as when she was taking it. She is on Viberzi for IBS diarrhea. Which have worsened after she had had an infection when she went on a trip out of the country during the summer. She was started on Viberzi in Sept 2017. She went back to taking Librax given by GI, but has not seen any improvement with this also on probiotics and OTC immodium.    She was also treated for viral URI about 3 weeks ago at an urgent care when she was out of town in [place]. She was prescribed an antibiotic but she never took it. She was given a steroid shot she was also given Flonase and Zyrtec. She still feels some weakness from it does not have any significant cough.  No significant weight change from her last visit in November Review Of Systems:  GEN- +fatigue, fever, weight loss,weakness, recent illness HEENT- denies eye drainage, change in vision, nasal discharge, CVS- denies chest pain, palpitations RESP- denies SOB, cough, wheeze ABD- denies N/V, +change in stools, abd pain GU- denies dysuria, hematuria, dribbling, incontinence MSK- denies joint pain, muscle aches, injury Neuro- denies headache, dizziness, syncope, seizure activity       Objective:    BP 120/72   Pulse 86   Temp 98.1 F (36.7 C) (Oral)   Resp 16   Ht 5\' 1"  (1.549 m)   Wt 149 lb (67.6 kg)   SpO2 98%   BMI 28.15 kg/m  GEN- NAD, alert and oriented x3 HEENT- PERRL, EOMI, non injected sclera, pink conjunctiva, MMM, oropharynx clear,TM Clear no effusion, nares clear Neck- Supple, no LAD CVS- RRR, no  murmur RESP-CTAB ABD-NABS,soft,NT,ND EXT- No edema Pulses- Radial  2+        Assessment & Plan:      Problem List Items Addressed This Visit    IBS (irritable bowel syndrome)    IBS long standing she has tried multiple medications. Now with side effects to Viberzi which did stop the diarrhea. She is currently in Librax given by her GI in past, recommend she schedule with them, I dont see anything else to add that she hasnt tried. She also had recent  URI, that is resolved, she still feels some weakness Check CBC/CMET in setting of worsening diarrhea as well. Note she did NOT take any antibiotics       Relevant Orders   Comprehensive metabolic panel   CBC with Differential/Platelet    Other Visit Diagnoses    Encounter for long-term (current) use of other high-risk medications          Note: This dictation was prepared with Dragon dictation along with smaller phrase technology. Any transcriptional errors that result from this process are unintentional.

## 2016-12-18 LAB — CBC WITH DIFFERENTIAL/PLATELET
BASOS ABS: 0 {cells}/uL (ref 0–200)
Basophils Relative: 0 %
EOS ABS: 183 {cells}/uL (ref 15–500)
Eosinophils Relative: 3 %
HEMATOCRIT: 38.5 % (ref 35.0–45.0)
HEMOGLOBIN: 12.5 g/dL (ref 12.0–15.0)
LYMPHS PCT: 25 %
Lymphs Abs: 1525 cells/uL (ref 850–3900)
MCH: 33.1 pg — AB (ref 27.0–33.0)
MCHC: 32.5 g/dL (ref 32.0–36.0)
MCV: 101.9 fL — ABNORMAL HIGH (ref 80.0–100.0)
MONO ABS: 549 {cells}/uL (ref 200–950)
MPV: 10 fL (ref 7.5–12.5)
Monocytes Relative: 9 %
NEUTROS PCT: 63 %
Neutro Abs: 3843 cells/uL (ref 1500–7800)
Platelets: 241 10*3/uL (ref 140–400)
RBC: 3.78 MIL/uL — ABNORMAL LOW (ref 3.80–5.10)
RDW: 13.6 % (ref 11.0–15.0)
WBC: 6.1 10*3/uL (ref 3.8–10.8)

## 2016-12-18 LAB — COMPREHENSIVE METABOLIC PANEL
ALT: 19 U/L (ref 6–29)
AST: 16 U/L (ref 10–35)
Albumin: 4.2 g/dL (ref 3.6–5.1)
Alkaline Phosphatase: 57 U/L (ref 33–130)
BUN: 15 mg/dL (ref 7–25)
CO2: 34 mmol/L — AB (ref 20–31)
CREATININE: 0.54 mg/dL (ref 0.50–1.05)
Calcium: 9.8 mg/dL (ref 8.6–10.4)
Chloride: 97 mmol/L — ABNORMAL LOW (ref 98–110)
GLUCOSE: 83 mg/dL (ref 70–99)
Potassium: 4.5 mmol/L (ref 3.5–5.3)
Sodium: 137 mmol/L (ref 135–146)
TOTAL PROTEIN: 6.8 g/dL (ref 6.1–8.1)
Total Bilirubin: 0.3 mg/dL (ref 0.2–1.2)

## 2017-02-05 ENCOUNTER — Encounter: Payer: Self-pay | Admitting: Gynecology

## 2017-03-12 ENCOUNTER — Other Ambulatory Visit: Payer: Self-pay | Admitting: Family Medicine

## 2017-03-12 NOTE — Telephone Encounter (Signed)
Denied, she had side effects based on our last visit Recommend she talk to her GI, like we dicussed in march

## 2017-03-12 NOTE — Telephone Encounter (Signed)
Ok to refill 

## 2017-03-17 ENCOUNTER — Other Ambulatory Visit: Payer: Self-pay | Admitting: Family Medicine

## 2017-03-17 NOTE — Telephone Encounter (Signed)
Per MD notes on 6/20:  Denied, she had side effects based on our last visit Recommend she talk to her GI, like we dicussed in march

## 2017-03-18 ENCOUNTER — Telehealth: Payer: Self-pay | Admitting: *Deleted

## 2017-03-18 MED ORDER — ELUXADOLINE 75 MG PO TABS: 1.0000 | ORAL_TABLET | Freq: Two times a day (BID) | ORAL | 0 refills | Status: AC

## 2017-03-18 NOTE — Telephone Encounter (Signed)
Medication called to pharmacy.  Call placed to patient to make aware. Phone line was hung up.

## 2017-03-18 NOTE — Telephone Encounter (Signed)
Received call from patient. Requested refill on Viberzi.   Per last visit (12/17/2016), patient stopped medication d/t concerns of SE and was advised to F/U with GI. Medication request from pharmacy has been denied by MD on 03/12/2017 citing concerns of SE and furhter recommendation to F/U with GI.   Patient reports that she began Viberzi x2 weeks prior and has noted medication effective in controlling loose stools. Denies any concern of SE. States that she is unsure when she will be able to get back in to see GI, and she is concerned that GI will not refill without an office visit.   Patient also states that she is "not happy" with recommendation to F/U with her GI for refills. Requested 30 day supply of Viberzi so that she can schedule appointment with GI.

## 2017-03-18 NOTE — Telephone Encounter (Signed)
Advise if she has SE like before to stop the medication Okay to give her a 30 day supply of the medication, but she really should see her GI

## 2017-04-08 ENCOUNTER — Other Ambulatory Visit: Payer: Self-pay | Admitting: Family Medicine

## 2017-04-12 ENCOUNTER — Other Ambulatory Visit: Payer: Self-pay | Admitting: Family Medicine

## 2017-06-30 ENCOUNTER — Other Ambulatory Visit: Payer: Self-pay | Admitting: Physician Assistant

## 2017-07-13 ENCOUNTER — Emergency Department (HOSPITAL_COMMUNITY): Payer: BLUE CROSS/BLUE SHIELD

## 2017-07-13 ENCOUNTER — Encounter (HOSPITAL_COMMUNITY): Payer: Self-pay | Admitting: Emergency Medicine

## 2017-07-13 ENCOUNTER — Emergency Department (HOSPITAL_COMMUNITY)
Admission: EM | Admit: 2017-07-13 | Discharge: 2017-07-14 | Disposition: A | Discharge status: Home / Self Care | Payer: BLUE CROSS/BLUE SHIELD | Attending: Emergency Medicine | Admitting: Emergency Medicine

## 2017-07-13 DIAGNOSIS — R110 Nausea: Secondary | ICD-10-CM

## 2017-07-13 DIAGNOSIS — A09 Infectious gastroenteritis and colitis, unspecified: Secondary | ICD-10-CM | POA: Diagnosis not present

## 2017-07-13 DIAGNOSIS — K529 Noninfective gastroenteritis and colitis, unspecified: Secondary | ICD-10-CM

## 2017-07-13 DIAGNOSIS — R1032 Left lower quadrant pain: Secondary | ICD-10-CM | POA: Diagnosis present

## 2017-07-13 LAB — COMPREHENSIVE METABOLIC PANEL
ALT: 19 U/L (ref 14–54)
AST: 18 U/L (ref 15–41)
Albumin: 3.8 g/dL (ref 3.5–5.0)
Alkaline Phosphatase: 66 U/L (ref 38–126)
Anion gap: 9 (ref 5–15)
BILIRUBIN TOTAL: 0.9 mg/dL (ref 0.3–1.2)
BUN: 5 mg/dL — ABNORMAL LOW (ref 6–20)
CHLORIDE: 96 mmol/L — AB (ref 101–111)
CO2: 27 mmol/L (ref 22–32)
Calcium: 8.8 mg/dL — ABNORMAL LOW (ref 8.9–10.3)
Creatinine, Ser: 0.58 mg/dL (ref 0.44–1.00)
GFR calc Af Amer: >60 mL/min (ref >60)
Glucose, Bld: 131 mg/dL — ABNORMAL HIGH (ref 65–99)
POTASSIUM: 3 mmol/L — AB (ref 3.5–5.1)
Sodium: 132 mmol/L — ABNORMAL LOW (ref 135–145)
TOTAL PROTEIN: 7.2 g/dL (ref 6.5–8.1)

## 2017-07-13 LAB — URINALYSIS, ROUTINE W REFLEX MICROSCOPIC
Bilirubin Urine: NEGATIVE
Glucose, UA: NEGATIVE mg/dL
Ketones, ur: 20 mg/dL — AB
LEUKOCYTES UA: NEGATIVE
Nitrite: NEGATIVE
PROTEIN: 100 mg/dL — AB
Specific Gravity, Urine: 1.013 (ref 1.005–1.030)
Trans Epithel, UA: 1
pH: 6 (ref 5.0–8.0)

## 2017-07-13 LAB — CBC
HEMATOCRIT: 37 % (ref 36.0–46.0)
Hemoglobin: 12.3 g/dL (ref 12.0–15.0)
MCH: 33.3 pg (ref 26.0–34.0)
MCHC: 33.2 g/dL (ref 30.0–36.0)
MCV: 100.3 fL — AB (ref 78.0–100.0)
PLATELETS: 176 10*3/uL (ref 150–400)
RBC: 3.69 MIL/uL — AB (ref 3.87–5.11)
RDW: 13.3 % (ref 11.5–15.5)
WBC: 5.1 10*3/uL (ref 4.0–10.5)

## 2017-07-13 LAB — I-STAT CG4 LACTIC ACID, ED: LACTIC ACID, VENOUS: 0.69 mmol/L (ref 0.5–1.9)

## 2017-07-13 LAB — LIPASE, BLOOD: LIPASE: 20 U/L (ref 11–51)

## 2017-07-13 MED ORDER — SODIUM CHLORIDE 0.9 % IV BOLUS (SEPSIS)
1000.0000 mL | Freq: Once | INTRAVENOUS | Status: AC
  Administered 2017-07-13: 1000 mL via INTRAVENOUS

## 2017-07-13 MED ORDER — POTASSIUM CHLORIDE CRYS ER 20 MEQ PO TBCR
40.0000 meq | EXTENDED_RELEASE_TABLET | Freq: Once | ORAL | Status: AC
  Administered 2017-07-13: 40 meq via ORAL
  Filled 2017-07-13: qty 2

## 2017-07-13 MED ORDER — CIPROFLOXACIN HCL 500 MG PO TABS: 500.0000 mg | ORAL_TABLET | Freq: Two times a day (BID) | ORAL | 0 refills | Status: AC

## 2017-07-13 MED ORDER — METRONIDAZOLE 500 MG PO TABS: 500.0000 mg | ORAL_TABLET | Freq: Three times a day (TID) | ORAL | 0 refills | Status: AC

## 2017-07-13 MED ORDER — ONDANSETRON HCL 4 MG PO TABS: 4.0000 mg | ORAL_TABLET | Freq: Three times a day (TID) | ORAL | 0 refills | Status: DC | PRN

## 2017-07-13 MED ORDER — ONDANSETRON HCL 4 MG/2ML IJ SOLN
4.0000 mg | Freq: Once | INTRAMUSCULAR | Status: AC
  Administered 2017-07-13: 4 mg via INTRAVENOUS
  Filled 2017-07-13: qty 2

## 2017-07-13 MED ORDER — IOPAMIDOL (ISOVUE-300) INJECTION 61%
INTRAVENOUS | Status: AC
  Administered 2017-07-13: 100 mL via INTRAVENOUS
  Filled 2017-07-13: qty 100

## 2017-07-13 MED ORDER — CIPROFLOXACIN HCL 500 MG PO TABS
500.0000 mg | ORAL_TABLET | Freq: Once | ORAL | Status: AC
  Administered 2017-07-13: 500 mg via ORAL
  Filled 2017-07-13: qty 1

## 2017-07-13 MED ORDER — OXYCODONE-ACETAMINOPHEN 5-325 MG PO TABS: 1.0000 | ORAL_TABLET | ORAL | 0 refills | Status: AC | PRN

## 2017-07-13 MED ORDER — DIPHENHYDRAMINE HCL 50 MG/ML IJ SOLN
25.0000 mg | Freq: Once | INTRAMUSCULAR | Status: AC
  Administered 2017-07-13: 25 mg via INTRAVENOUS
  Filled 2017-07-13: qty 1

## 2017-07-13 MED ORDER — HYDROMORPHONE HCL 1 MG/ML IJ SOLN
1.0000 mg | Freq: Once | INTRAMUSCULAR | Status: AC
  Administered 2017-07-13: 1 mg via INTRAVENOUS
  Filled 2017-07-13: qty 1

## 2017-07-13 MED ORDER — METRONIDAZOLE 500 MG PO TABS
500.0000 mg | ORAL_TABLET | Freq: Once | ORAL | Status: AC
  Administered 2017-07-13: 500 mg via ORAL
  Filled 2017-07-13: qty 1

## 2017-07-13 NOTE — Discharge Instructions (Signed)
Your imaging showed evidence of colitis.  As we are not certain this is C. difficile, please discontinue the oral vancomycin and take the oral Cipro and Flagyl.  Please use the pain and nausea medicine to help with your symptoms.  Please stay hydrated and follow-up with a primary care physician for reassessment in several days.  If any symptoms change or worsen, please return to the nearest emergency department.

## 2017-07-13 NOTE — ED Notes (Signed)
Made PA aware of 99.4 degree temperature and 106 pulse. Was advised to start liter bolus. Will continue to monitor.

## 2017-07-13 NOTE — ED Provider Notes (Signed)
[facility] EMERGENCY DEPARTMENT Provider Note   CSN: [id number] Arrival date & time: 07/13/17  1238     History   Chief Complaint Chief Complaint  Patient presents with  . Abdominal Pain  . Diarrhea    HPI Beth Noble is a 61 y.o. female.    The history is provided by the patient and medical records. No language interpreter was used.  Abdominal Pain   This is a new problem. The current episode started more than 2 days ago. The problem occurs constantly. The problem has not changed since onset.The pain is located in the LLQ. The quality of the pain is aching, cramping and sharp. The pain is at a severity of 10/10. The pain is severe. Associated symptoms include diarrhea, nausea, vomiting and headaches. Pertinent negatives include fever, belching, melena, constipation, dysuria and frequency. The symptoms are aggravated by palpation. Nothing relieves the symptoms. Past workup does not include GI consult. Her past medical history is significant for irritable bowel syndrome.  Diarrhea   This is a recurrent problem. The current episode started more than 2 days ago. The problem occurs more than 10 times per day. The problem has not changed since onset.The stool consistency is described as watery. There has been no fever. Associated symptoms include abdominal pain, vomiting and headaches. Pertinent negatives include no chills, no sweats, no URI and no cough. She has tried nothing for the symptoms. Her past medical history is significant for irritable bowel syndrome.    Past Medical History:  Diagnosis Date  . Cancer (HCC)    Breast cancer-Intraductal and DCIS  . Elevated cholesterol   . Endometriosis   . IBS (irritable bowel syndrome)   . Osteoporosis 02/2014   T score of -2.7 L3, L4    Patient Active Problem List   Diagnosis Date Noted  . IBS (irritable bowel syndrome) 12/17/2016  . Osteoporosis 12/05/2015  . 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 MDD  (major depressive disorder) 06/07/2014  . Chronic back pain 08/30/2013  . Dysuria 08/30/2013  . Elevated cholesterol   . Endometriosis   . History of breast cancer     Past Surgical History:  Procedure Laterality Date  . BREAST SURGERY     Bilateral Mastectomy and reconstruction  . BREAST SURGERY     lumpectomy  . Herniated disc     Neck  . OOPHORECTOMY  2007   RSO and LSO  . TUBAL LIGATION    . VAGINAL HYSTERECTOMY    . Varicose veins      OB History    Gravida Para Term Preterm AB Living   2 2 2     2    SAB TAB Ectopic Multiple Live Births                   Home Medications    Prior to Admission medications   Medication Sig Start Date End Date Taking? Authorizing Provider  calcium carbonate (OS-CAL) 600 MG TABS tablet Take 600 mg by mouth 2 (two) times daily with a meal.    [provider]  Cholecalciferol (VITAMIN D3) 1000 UNITS CAPS Take 1,000 Int'l Units by mouth 2 (two) times daily.    [provider]  Eluxadoline (VIBERZI) 75 MG TABS Take 1 tablet by mouth 2 (two) times daily. 03/18/17   Alycia Rossetti, MD  gabapentin (NEURONTIN) 300 MG capsule TAKE 2 CAPSULES BY MOUTH 3 TIMES A DAY Patient taking differently: TAKE 2 CAPSULES BY MOUTH 3 TIMES  A DAY PRN 04/08/16   Alycia Rossetti, MD  gabapentin (NEURONTIN) 300 MG capsule TAKE 2 CAPSULES BY MOUTH 3 TIMES A DAY 04/08/17   Alycia Rossetti, MD  risedronate (ACTONEL) 150 MG tablet TAKE 1 TABLET BY MOUTH ONCE A MONTH 06/30/17   Alycia Rossetti, MD  sertraline (ZOLOFT) 100 MG tablet TAKE 1 AND 1/2 TABLETS BY MOUTH AT BEDTIME 04/14/17   Alycia Rossetti, MD    Family History Family History  Problem Relation Age of Onset  . Diabetes Son   . Atrial fibrillation Mother   . Heart disease Father   . Cancer Sister        skin cancer - chemotherapy    Social History Social History  Substance Use Topics  . Smoking status: Former Research scientist (life sciences)  . Smokeless tobacco: Never Used  . Alcohol use 4.0  oz/week    8 Standard drinks or equivalent per week     Allergies   Codeine and Morphine and related   Review of Systems Review of Systems  Constitutional: Negative for chills, diaphoresis, fatigue and fever.  HENT: Negative for congestion and rhinorrhea.   Eyes: Negative for visual disturbance.  Respiratory: Negative for cough, chest tightness, shortness of breath, wheezing and stridor.   Cardiovascular: Negative for chest pain and palpitations.  Gastrointestinal: Positive for abdominal pain, diarrhea, nausea and vomiting. Negative for abdominal distention, constipation and melena.  Genitourinary: Negative for decreased urine volume, difficulty urinating, dysuria, flank pain, frequency, vaginal bleeding, vaginal discharge and vaginal pain.  Musculoskeletal: Negative for back pain, neck pain and neck stiffness.  Skin: Negative for rash and wound.  Neurological: Positive for light-headedness and headaches. Negative for weakness and numbness.  Psychiatric/Behavioral: Negative for agitation.  All other systems reviewed and are negative.    Physical Exam Updated Vital Signs BP 125/77   Pulse (!) 118   Temp 98.7 F (37.1 C) (Oral)   Resp 18   Ht 5' (1.524 m)   Wt 67.1 kg (148 lb)   SpO2 100%   BMI 28.90 kg/m   Physical Exam  Constitutional: She is oriented to person, place, and time. She appears well-developed and well-nourished. No distress.  HENT:  Head: Normocephalic and atraumatic.  Mouth/Throat: Oropharynx is clear and moist. No oropharyngeal exudate.  Eyes: Pupils are equal, round, and reactive to light. Conjunctivae and EOM are normal.  Neck: Normal range of motion. Neck supple.  Cardiovascular: Regular rhythm and intact distal pulses.   No murmur heard. Pulmonary/Chest: Effort normal and breath sounds normal. No stridor. No respiratory distress. She has no wheezes. She exhibits no tenderness.  Abdominal: Soft. Normal appearance and bowel sounds are normal. She  exhibits no distension. There is tenderness in the left lower quadrant. There is no rigidity, no rebound, no guarding and no CVA tenderness.    Musculoskeletal: She exhibits no edema or tenderness.  Neurological: She is alert and oriented to person, place, and time. No sensory deficit. She exhibits normal muscle tone.  Skin: Skin is warm and dry. Capillary refill takes less than 2 seconds. No rash noted. She is not diaphoretic. No erythema.  Psychiatric: She has a normal mood and affect.  Nursing note and vitals reviewed.    ED Treatments / Results  Labs (all labs ordered are listed, but only abnormal results are displayed) Labs Reviewed  COMPREHENSIVE METABOLIC PANEL - Abnormal; Notable for the following:       Result Value   Sodium 132 (*)    Potassium  3.0 (*)    Chloride 96 (*)    Glucose, Bld 131 (*)    BUN 5 (*)    Calcium 8.8 (*)    All other components within normal limits  CBC - Abnormal; Notable for the following:    RBC 3.69 (*)    MCV 100.3 (*)    All other components within normal limits  URINALYSIS, ROUTINE W REFLEX MICROSCOPIC - Abnormal; Notable for the following:    APPearance HAZY (*)    Hgb urine dipstick SMALL (*)    Ketones, ur 20 (*)    Protein, ur 100 (*)    Bacteria, UA RARE (*)    Squamous Epithelial / LPF 6-30 (*)    All other components within normal limits  LIPASE, BLOOD  I-STAT CG4 LACTIC ACID, ED    EKG  EKG Interpretation None       Radiology Ct Abdomen Pelvis W Contrast  Result Date: 07/13/2017 CLINICAL DATA:  Diarrhea.  History of breast cancer EXAM: CT ABDOMEN AND PELVIS WITH CONTRAST TECHNIQUE: Multidetector CT imaging of the abdomen and pelvis was performed using the standard protocol following bolus administration of intravenous contrast. CONTRAST:  157mL ISOVUE-300 IOPAMIDOL (ISOVUE-300) INJECTION 61% COMPARISON:  05/17/2016 FINDINGS: Lower chest: Lung bases clear Hepatobiliary: Normal liver.  Gallbladder and bile ducts normal.  Pancreas: Negative Spleen: Negative Adrenals/Urinary Tract: 9 mm left lower pole renal calculus. No renal obstruction or mass. Tiny gas bubble in the urinary bladder. Stomach/Bowel: Extensive bowel wall edema involving the transverse colon extending into the left colon down to the sigmoid colon. Rectum spared. Normal appendix. Negative for bowel obstruction. No abscess. Vascular/Lymphatic: Mild atherosclerotic disease without aneurysm. No adenopathy Reproductive: Hysterectomy.  Negative for pelvic mass. Other: No free fluid. Musculoskeletal: Lumbar degenerative changes. No acute skeletal abnormality. IMPRESSION: Extensive edema in the transverse and left colon most compatible with infectious colitis. No bowel obstruction or abscess. 9 mm nonobstructing calculus left lower pole. Electronically Signed   By: Franchot Gallo M.D.   On: 07/13/2017 18:08    Procedures Procedures (including critical care time)  Medications Ordered in ED Medications  ondansetron (ZOFRAN) injection 4 mg (4 mg Intravenous Given 07/13/17 1541)  HYDROmorphone (DILAUDID) injection 1 mg (1 mg Intravenous Given 07/13/17 1541)  sodium chloride 0.9 % bolus 1,000 mL (0 mLs Intravenous Stopped 07/13/17 1639)  potassium chloride SA (K-DUR,KLOR-CON) CR tablet 40 mEq (40 mEq Oral Given 07/13/17 1541)  iopamidol (ISOVUE-300) 61 % injection (100 mLs Intravenous Contrast Given 07/13/17 1709)  diphenhydrAMINE (BENADRYL) injection 25 mg (25 mg Intravenous Given 07/13/17 1810)  sodium chloride 0.9 % bolus 1,000 mL (0 mLs Intravenous Stopped 07/13/17 2126)  ciprofloxacin (CIPRO) tablet 500 mg (500 mg Oral Given 07/13/17 2033)  metroNIDAZOLE (FLAGYL) tablet 500 mg (500 mg Oral Given 07/13/17 2033)     Initial Impression / Assessment and Plan / ED Course  I have reviewed the triage vital signs and the nursing notes.  Pertinent labs & imaging results that were available during my care of the patient were reviewed by me and considered in my  medical decision making (see chart for details).     Beth Noble is a 61 y.o. female with a past medical history significant for endometriosis, breast cancer, osteoporosis, hypercholesterolemia, and irritable bowel syndrome who presents with left lower quadrant abdominal pain and diarrhea.  Patient reports that she has had abdominal pain for the last 4 days.  She says that it has been greater than 10 episodes of watery foul-smelling  diarrhea.  She reports that she called a tele-doc yesterday and was given a prescription of vancomycin for presumed C. difficile infection.  She denies any history of this and denies recent antibiotics.  She reports that this feels similar but worse to prior irritable bowel syndrome flares.  She describes the abdominal pain as up to a 10 out of 10 in severity in her left lower quadrant.  She also reports that it is currently 6 out of 10.  She describes it as cramping and sharp.  She denies any history of diverticulitis or kidney stones.  She reports nausea but no vomiting.  She denies any urinary symptoms.  She denies fevers, chills, chest pain, shortness of breath, or cough.  Patient reports no constipation and reports continuing to pass flatus.  Denies vaginal discharge or vaginal bleeding.  Patient thinks that the bacon that she ate on Wednesday may have precipitated her diarrhea beginning the following morning.  On exam, left lower quadrant is tender to palpation.  No CVA tenderness.  Lungs are clear.  Lower extremities were nontender.  No focal neurologic deficits.  Based on symptoms and description of similar to prior, suspect irritable bowel syndrome flare in the setting of the greasy food typically precipitates her episodes.  However, given the tenderness she is experiencing, will have workup to look for other etiologies of the discomfort.  Patient will have pain medicine, nausea medicine, and fluids administered.  Patient appears dehydrated.  She will also  have lab testing and CT imaging to evaluate.  Diagnostic testing results seen above.  Potassium was low, this was repleted.  Lactic acid normal.   Patient found to have evidence of colitis on CT imaging.  No diverticulitis.  As diarrhea has only been going on for several days and she has no history of recent antibiotic use, do not feel that vancomycin is the best treatment course at this time.  C. difficile testing was ordered however, patient will be given Cipro and Flagyl for colitis.  Patient will also be given nausea medication and pain medications.  Patient will follow-up with PCP.  Given improvement in symptoms after medications and improvement in symptoms after fluids, suspect patient is safe for discharge home.  Patient voiced understanding of the plan of care and outpatient management plan.  Patient had no other questions or concerns and was discharged in good condition with improving symptoms.    Final Clinical Impressions(s) / ED Diagnoses   Final diagnoses:  Diarrhea of infectious origin  Colitis  Left lower quadrant pain  Nausea    New Prescriptions Discharge Medication List as of 07/13/2017 11:54 PM    START taking these medications   Details  ciprofloxacin (CIPRO) 500 MG tablet Take 1 tablet (500 mg total) by mouth every 12 (twelve) hours., Starting Sun 07/13/2017, Until Wed 07/23/2017, Print    metroNIDAZOLE (FLAGYL) 500 MG tablet Take 1 tablet (500 mg total) by mouth 3 (three) times daily., Starting Sun 07/13/2017, Until Wed 07/23/2017, Print    ondansetron (ZOFRAN) 4 MG tablet Take 1 tablet (4 mg total) by mouth every 8 (eight) hours as needed for nausea or vomiting., Starting Sun 07/13/2017, Print    oxyCODONE-acetaminophen (PERCOCET/ROXICET) 5-325 MG tablet Take 1 tablet by mouth every 4 (four) hours as needed for severe pain., Starting Sun 07/13/2017, Print       Clinical Impression: 1. Diarrhea of infectious origin   2. Colitis   3. Left lower quadrant  pain   4. Nausea  Disposition: Discharge  Condition: Good  I have discussed the results, Dx and Tx plan with the pt(& family if present). He/she/they expressed understanding and agree(s) with the plan. Discharge instructions discussed at great length. Strict return precautions discussed and pt &/or family have verbalized understanding of the instructions. No further questions at time of discharge.    Discharge Medication List as of 07/13/2017 11:54 PM    START taking these medications   Details  ciprofloxacin (CIPRO) 500 MG tablet Take 1 tablet (500 mg total) by mouth every 12 (twelve) hours., Starting Sun 07/13/2017, Until Wed 07/23/2017, Print    metroNIDAZOLE (FLAGYL) 500 MG tablet Take 1 tablet (500 mg total) by mouth 3 (three) times daily., Starting Sun 07/13/2017, Until Wed 07/23/2017, Print    ondansetron (ZOFRAN) 4 MG tablet Take 1 tablet (4 mg total) by mouth every 8 (eight) hours as needed for nausea or vomiting., Starting Sun 07/13/2017, Print    oxyCODONE-acetaminophen (PERCOCET/ROXICET) 5-325 MG tablet Take 1 tablet by mouth every 4 (four) hours as needed for severe pain., Starting Sun 07/13/2017, Print        Follow Up: Alycia Rossetti, [place] [address] [place] [address] [phone]  Schedule an appointment as soon as possible for a visit    [facility] [address] [id number] [place] [phone]  If symptoms worsen     Tegeler, Gwenyth Allegra, MD 07/14/17 [phone]

## 2017-07-13 NOTE — ED Triage Notes (Addendum)
Pt states Thursday she began having diarrhea with body aches, denies fevers. Tachycardic at triage, but afebrile. Pt states pain to abdomen to LLQ. Pt has no hx of diverticulitis but has IBS. Pt states she feels dehydrated and weak.

## 2017-07-13 NOTE — ED Notes (Signed)
Pt. At desk asking about when she would be discharged. Made pt. Aware that we are waiting on orders. Will continue to monitor.

## 2017-07-13 NOTE — ED Notes (Signed)
Writer provided pt with 1L/min of O2, pt's O2 sats dropping to 86% on RM air

## 2017-07-13 NOTE — ED Notes (Signed)
MD made aware, orders for Benadryl.

## 2017-07-13 NOTE — ED Notes (Signed)
Assisted pt to BR.  Pt noted to  Having swelling to bilateral eyes, R>L and redness.  Mild itching to body.  Questioned pt as to when itching and swelling started- before CT or after.  Pt reports after Potassium, Zofran, and Dilaudid.

## 2017-08-19 ENCOUNTER — Ambulatory Visit: Payer: BLUE CROSS/BLUE SHIELD | Admitting: Family Medicine

## 2017-08-19 ENCOUNTER — Other Ambulatory Visit: Payer: Self-pay

## 2017-08-19 ENCOUNTER — Encounter: Payer: Self-pay | Admitting: Family Medicine

## 2017-08-19 VITALS — BP 130/68 | HR 98 | Temp 98.2°F | Resp 16 | Ht 61.0 in | Wt 145.0 lb

## 2017-08-19 DIAGNOSIS — Z23 Encounter for immunization: Secondary | ICD-10-CM

## 2017-08-19 DIAGNOSIS — K644 Residual hemorrhoidal skin tags: Secondary | ICD-10-CM | POA: Diagnosis not present

## 2017-08-19 DIAGNOSIS — N760 Acute vaginitis: Secondary | ICD-10-CM | POA: Diagnosis not present

## 2017-08-19 LAB — WET PREP FOR TRICH, YEAST, CLUE

## 2017-08-19 MED ORDER — FLUCONAZOLE 150 MG PO TABS: 150.0000 mg | ORAL_TABLET | Freq: Once | ORAL | 0 refills | Status: AC

## 2017-08-19 MED ORDER — HYDROCORTISONE ACE-PRAMOXINE 1-1 % RE FOAM: 1.0000 | Freq: Two times a day (BID) | RECTAL | 2 refills | Status: AC

## 2017-08-19 MED ORDER — NYSTATIN 100000 UNIT/GM EX CREA: 1.0000 "application " | TOPICAL_CREAM | Freq: Two times a day (BID) | CUTANEOUS | 0 refills | Status: DC

## 2017-08-19 NOTE — Progress Notes (Signed)
   Subjective:    Patient ID: Beth Noble, female    DOB: [birth year], 61 y.o.   MRN: [id number]  Patient presents for Vaginal Discharge (x1 week- thick white discharge in vagina folds)  Note seen in ED back in Oct with  Diarrhea, abdominal pain, diagnosed with Colitis , CT scan confirmed, treated with Cipro and Flagyl  She has been on and off Viberzi Diarrhea has improved, more of her baseline  She had hemorroidsla bleeding with colitis, whe she was checking for more blood sge  noticed white discharge about 1 week ago, with some burning itching  She uses antibacterial creams  She was also upset that no one repeated stool cultures on her, including GI?   Review Of Systems:  GEN- denies fatigue, fever, weight loss,weakness, recent illness HEENT- denies eye drainage, change in vision, nasal discharge, CVS- denies chest pain, palpitations RESP- denies SOB, cough, wheeze ABD- denies N/V, change in stools, abd pain GU- denies dysuria, hematuria, dribbling, incontinence MSK- denies joint pain, muscle aches, injury Neuro- denies headache, dizziness, syncope, seizure activity       Objective:    BP 130/68   Pulse 98   Temp 98.2 F (36.8 C) (Oral)   Resp 16   Ht 5\' 1"  (1.549 m)   Wt 145 lb (65.8 kg)   SpO2 94%   BMI 27.40 kg/m  GEN- NAD, alert and oriented x3 Abd-NABS, soft,NT,ND GU- normal external genitalia, vaginal mucosa pink and moist,, no uterus/cervix, white discharge between labial folds, mild erythema, minimal discharge in vault Rectum- Normal tone, external hemorroids, FOBT neg      Assessment & Plan:      Problem List Items Addressed This Visit    None    Visit Diagnoses    Acute vaginitis    -  Primary   Based on apperance, more yeast infection externally , recent antibiotics, given nystatin cream, diflucan x 1   Relevant Orders   WET PREP FOR [place], YEAST, CLUE (Completed)   External hemorrhoids       No active bleeding, given proctofoam to try,  sitz baths, F/U GI    Need for immunization against influenza       Relevant Orders   Flu Vaccine QUAD 36+ mos IM (Completed)      Note: This dictation was prepared with Dragon dictation along with smaller phrase technology. Any transcriptional errors that result from this process are unintentional.

## 2017-08-19 NOTE — Patient Instructions (Addendum)
Sitz baths with epson salt Use topical steroids try proctofoam Use the topical for yeast for about a week Take diflucan FLu shot given F/U as needed

## 2017-09-08 ENCOUNTER — Other Ambulatory Visit: Payer: Self-pay | Admitting: *Deleted

## 2017-09-08 MED ORDER — FLUCONAZOLE 150 MG PO TABS: 150.0000 mg | ORAL_TABLET | Freq: Every day | ORAL | 1 refills | Status: AC

## 2017-09-08 MED ORDER — NYSTATIN 100000 UNIT/GM EX CREA: 1.0000 "application " | TOPICAL_CREAM | Freq: Two times a day (BID) | CUTANEOUS | 0 refills | Status: AC

## 2017-10-21 ENCOUNTER — Other Ambulatory Visit: Payer: Self-pay | Admitting: Family Medicine

## 2018-01-23 ENCOUNTER — Other Ambulatory Visit: Payer: Self-pay | Admitting: Family Medicine

## 2018-02-01 IMAGING — CT CT ABD-PELV W/ CM
2 of 5 series · 17 of 46 positions shown, 19 images · IV contrast (APPLIED)
Comparison: 05/17/2016

CLINICAL DATA: Diarrhea.  History of breast cancer

EXAM:
CT ABDOMEN AND PELVIS WITH CONTRAST
TECHNIQUE: Multidetector CT imaging of the abdomen and pelvis was performed
using the standard protocol following bolus administration of
intravenous contrast.
CONTRAST:  100mL 68DI2B-277 IOPAMIDOL (68DI2B-277) INJECTION 61%

[Series 3: abd/ pelvis 5.0 i30f 2 · axial · 0.80mm/px · z∈[+874,+1268]mm · 14 of 89 slices shown, 16 images]
[im 5/89  soft-tissue]
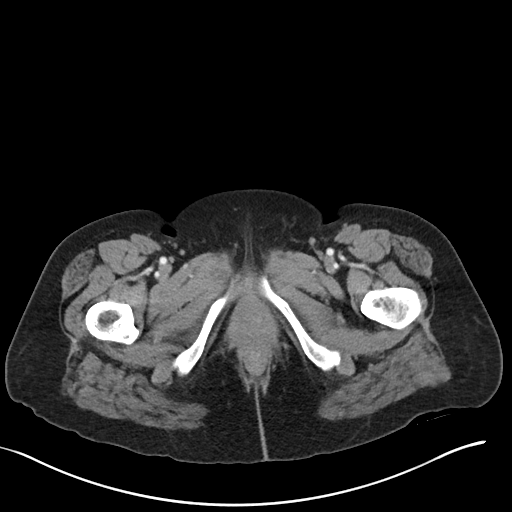
[im 5/89  bone]
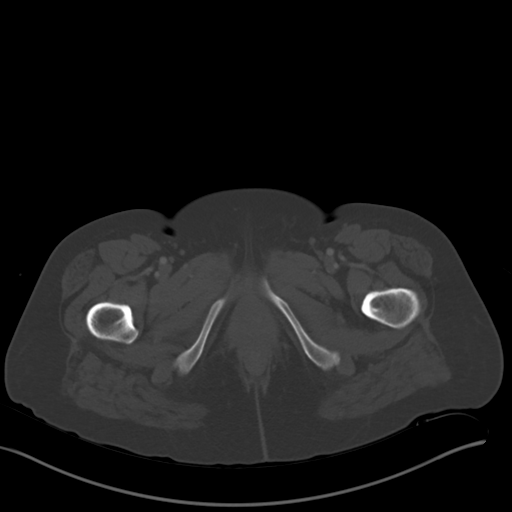
[im 14/89  soft-tissue]
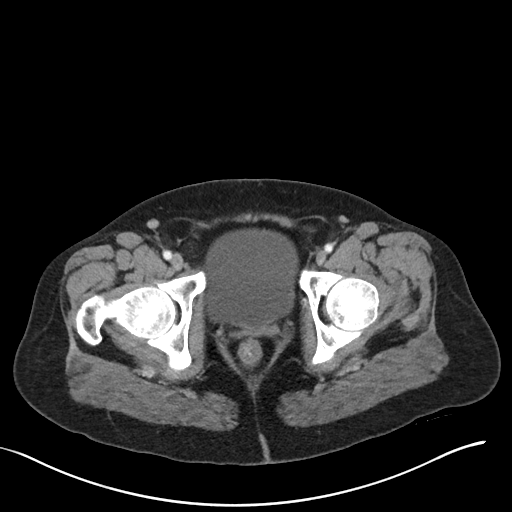
[im 18/89  soft-tissue]
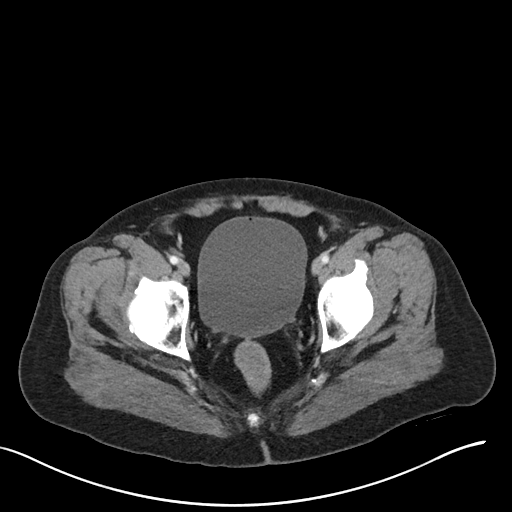
[im 23/89  soft-tissue]
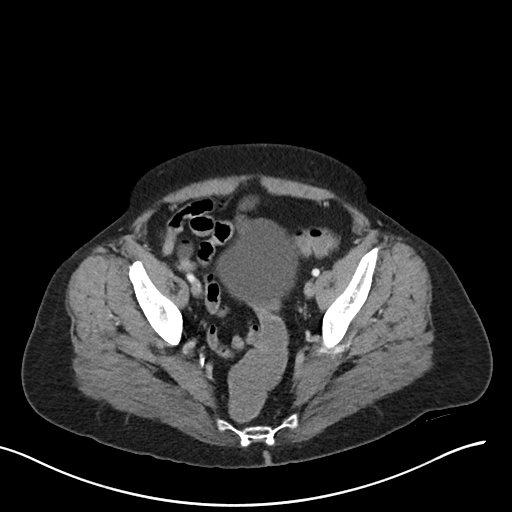
[im 31/89  soft-tissue]
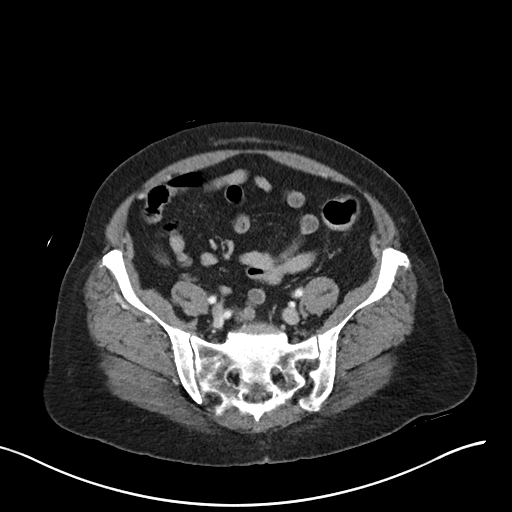
[im 36/89  soft-tissue]
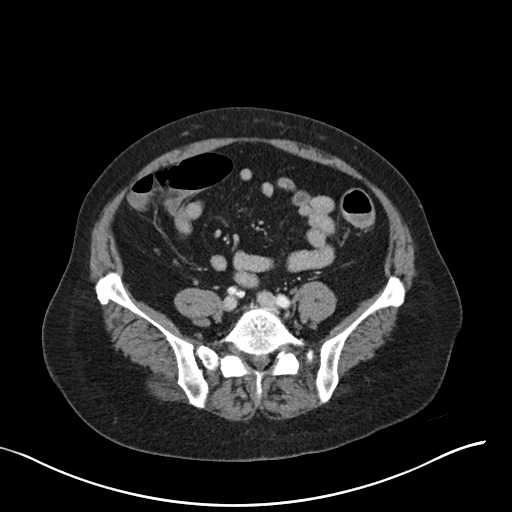
[im 40/89  soft-tissue]
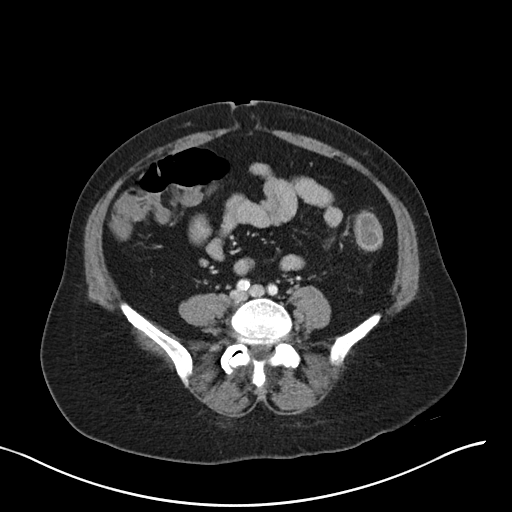
[im 49/89  soft-tissue]
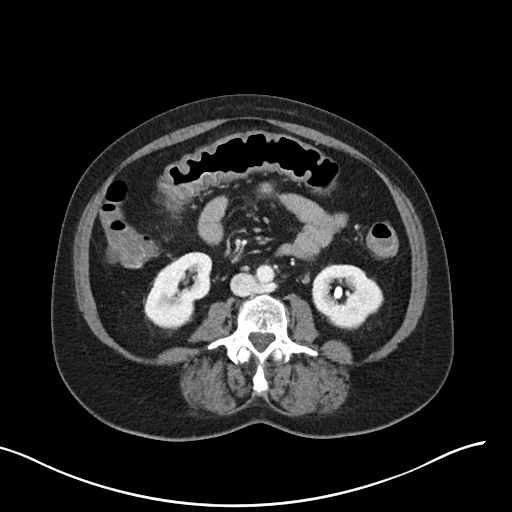
[im 53/89  soft-tissue]
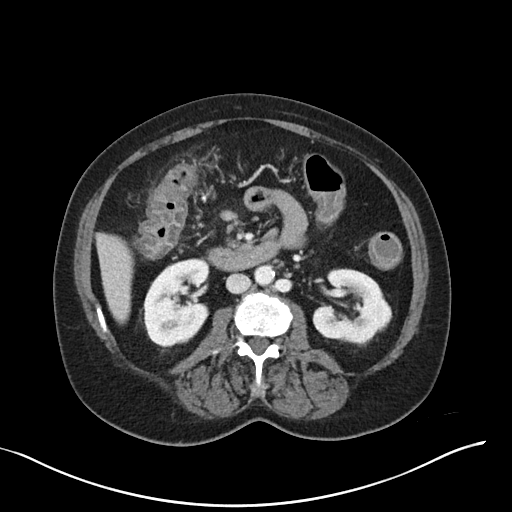
[im 53/89  bone]
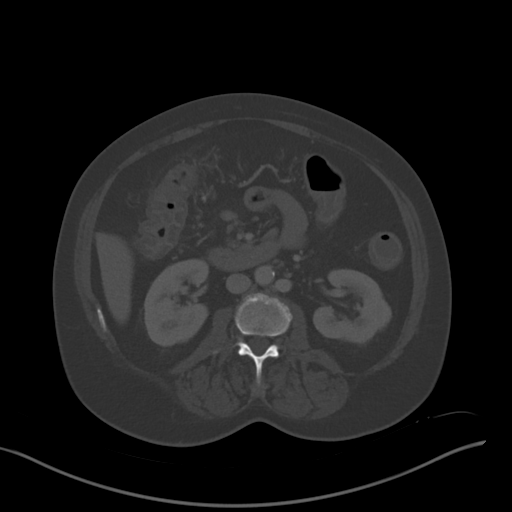
[im 58/89  soft-tissue]
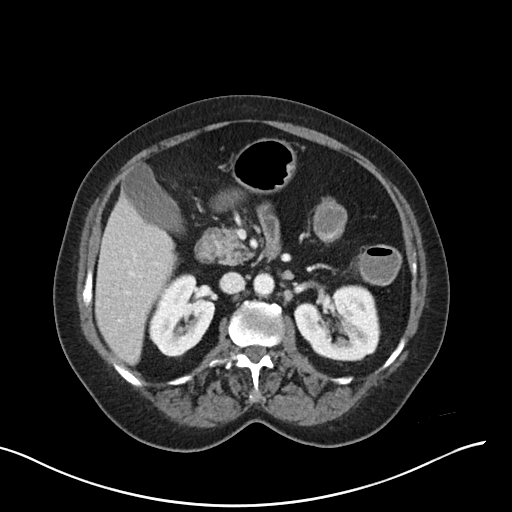
[im 67/89  soft-tissue]
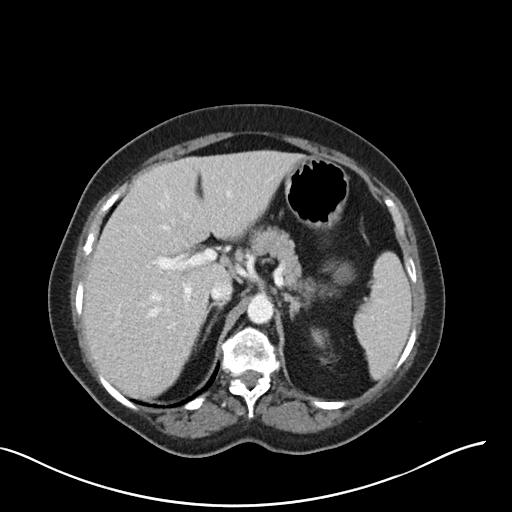
[im 71/89  soft-tissue]
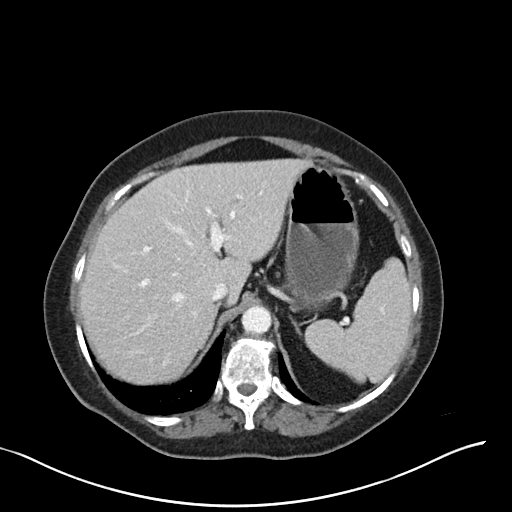
[im 75/89  soft-tissue]
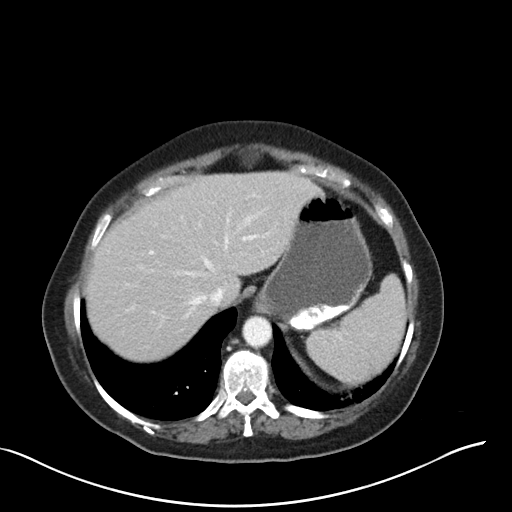
[im 84/89  soft-tissue]
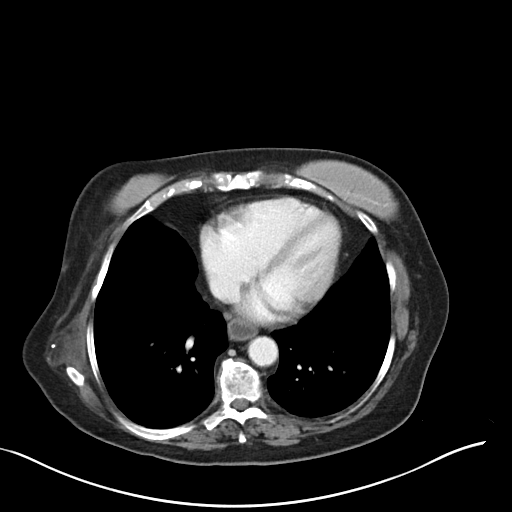

[Series 6: coronal soft tissue · coronal · 0.82mm/px · 3 of 101 slices shown]
[im 34/101  soft-tissue]
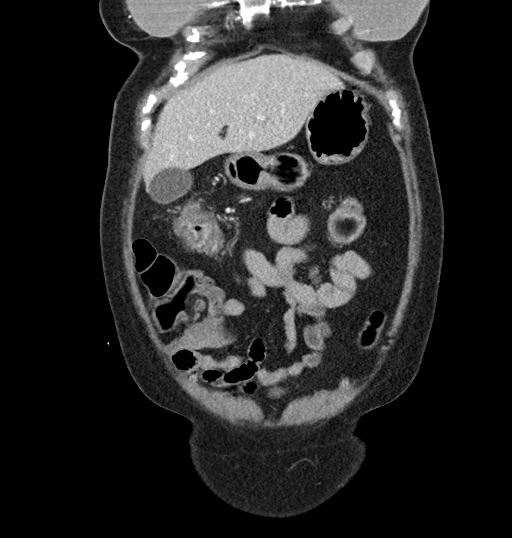
[im 45/101  soft-tissue]
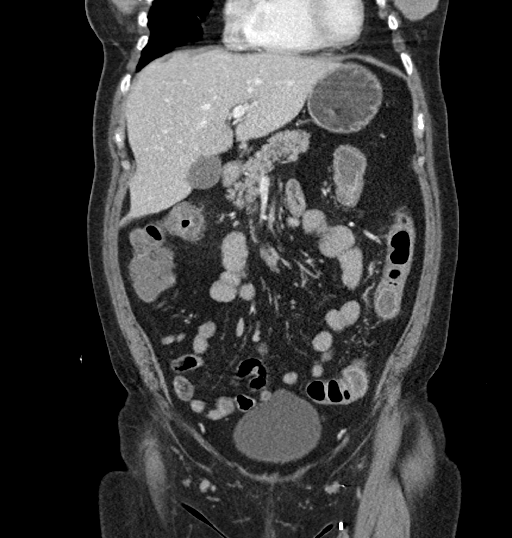
[im 56/101  soft-tissue]
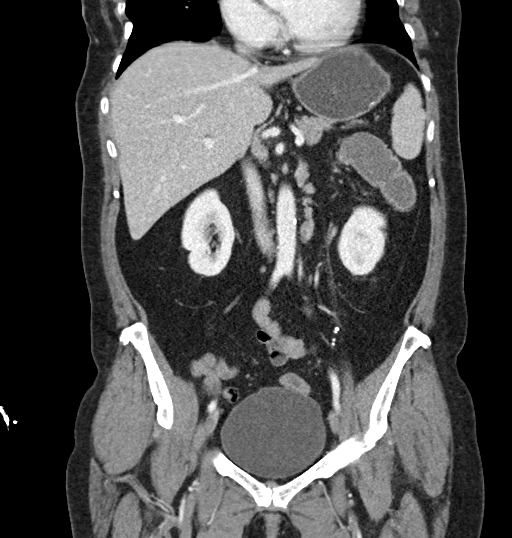

[17 of 46 positions shown; findings below may reference images not displayed]

FINDINGS: Lower chest: Lung bases clear

Hepatobiliary: Normal liver.  Gallbladder and bile ducts normal.

Pancreas: Negative

Spleen: Negative

Adrenals/Urinary Tract: 9 mm left lower pole renal calculus. No
renal obstruction or mass. Tiny gas bubble in the urinary bladder.

Stomach/Bowel: Extensive bowel wall edema involving the transverse
colon extending into the left colon down to the sigmoid colon.
Rectum spared. Normal appendix. Negative for bowel obstruction. No
abscess.

Vascular/Lymphatic: Mild atherosclerotic disease without aneurysm.
No adenopathy

Reproductive: Hysterectomy.  Negative for pelvic mass.

Other: No free fluid.

Musculoskeletal: Lumbar degenerative changes. No acute skeletal
abnormality.
IMPRESSION: Extensive edema in the transverse and left colon most compatible
with infectious colitis. No bowel obstruction or abscess.

9 mm nonobstructing calculus left lower pole.

## 2018-03-12 ENCOUNTER — Other Ambulatory Visit: Payer: Self-pay | Admitting: Family Medicine

## 2018-05-04 ENCOUNTER — Other Ambulatory Visit: Payer: Self-pay | Admitting: Family Medicine

## 2018-05-29 ENCOUNTER — Other Ambulatory Visit: Payer: Self-pay | Admitting: Family Medicine

## 2018-07-08 ENCOUNTER — Other Ambulatory Visit: Payer: Self-pay | Admitting: Family Medicine

## 2018-08-08 ENCOUNTER — Other Ambulatory Visit: Payer: Self-pay | Admitting: Family Medicine

## 2018-08-31 ENCOUNTER — Other Ambulatory Visit: Payer: Self-pay | Admitting: Family Medicine
